# Patient Record
Sex: Male | Born: 1945
Health system: Southern US, Community
[De-identification: ages and names within clinical notes are randomized; demographics above are authoritative.]

## PROBLEM LIST (undated history)

## (undated) DIAGNOSIS — R768 Other specified abnormal immunological findings in serum: Secondary | ICD-10-CM

## (undated) DIAGNOSIS — B019 Varicella without complication: Secondary | ICD-10-CM

## (undated) DIAGNOSIS — B269 Mumps without complication: Secondary | ICD-10-CM

## (undated) DIAGNOSIS — H9319 Tinnitus, unspecified ear: Secondary | ICD-10-CM

## (undated) DIAGNOSIS — B069 Rubella without complication: Secondary | ICD-10-CM

## (undated) DIAGNOSIS — I471 Supraventricular tachycardia: Secondary | ICD-10-CM

## (undated) DIAGNOSIS — K649 Unspecified hemorrhoids: Secondary | ICD-10-CM

## (undated) DIAGNOSIS — C801 Malignant (primary) neoplasm, unspecified: Secondary | ICD-10-CM

## (undated) HISTORY — DX: Varicella without complication: B01.9

## (undated) HISTORY — DX: Rubella without complication: B06.9

## (undated) HISTORY — DX: Supraventricular tachycardia: I47.1

## (undated) HISTORY — DX: Mumps without complication: B26.9

## (undated) HISTORY — DX: Tinnitus, unspecified ear: H93.19

## (undated) HISTORY — DX: Other specified abnormal immunological findings in serum: R76.8

## (undated) HISTORY — DX: Unspecified hemorrhoids: K64.9

---
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
## 1971-05-23 HISTORY — PX: INGUINAL HERNIA REPAIR: SUR1180

## 1991-05-23 DIAGNOSIS — I471 Supraventricular tachycardia, unspecified: Secondary | ICD-10-CM

## 1991-05-23 HISTORY — DX: Supraventricular tachycardia: I47.1

## 1991-05-23 HISTORY — DX: Supraventricular tachycardia, unspecified: I47.10

## 2001-04-21 HISTORY — PX: INGUINAL HERNIA REPAIR: SUR1180

## 2001-06-21 ENCOUNTER — Ambulatory Visit (HOSPITAL_BASED_OUTPATIENT_CLINIC_OR_DEPARTMENT_OTHER): Admission: RE | Admit: 2001-06-21 | Discharge: 2001-06-21 | Payer: Self-pay | Admitting: Surgery

## 2001-06-21 ENCOUNTER — Encounter (INDEPENDENT_AMBULATORY_CARE_PROVIDER_SITE_OTHER): Payer: Self-pay

## 2004-08-10 ENCOUNTER — Encounter: Admission: RE | Admit: 2004-08-10 | Discharge: 2004-09-20 | Payer: Self-pay | Admitting: Family Medicine

## 2010-04-21 HISTORY — PX: INGUINAL HERNIA REPAIR: SUR1180

## 2010-05-04 ENCOUNTER — Ambulatory Visit (HOSPITAL_COMMUNITY)
Admission: RE | Admit: 2010-05-04 | Discharge: 2010-05-04 | Payer: Self-pay | Source: Home / Self Care | Attending: Surgery | Admitting: Surgery

## 2010-08-02 LAB — CBC
HCT: 47.7 % (ref 39.0–52.0)
Hemoglobin: 16.2 g/dL (ref 13.0–17.0)
MCH: 32.4 pg (ref 26.0–34.0)
MCHC: 34 g/dL (ref 30.0–36.0)
MCV: 95.4 fL (ref 78.0–100.0)
RBC: 5 MIL/uL (ref 4.22–5.81)

## 2010-08-02 LAB — BASIC METABOLIC PANEL
Calcium: 9.3 mg/dL (ref 8.4–10.5)
Chloride: 101 mEq/L (ref 96–112)
GFR calc non Af Amer: >60 mL/min (ref >60)
Glucose, Bld: 93 mg/dL (ref 70–99)
Potassium: 4.1 mEq/L (ref 3.5–5.1)

## 2010-08-02 LAB — SURGICAL PCR SCREEN: Staphylococcus aureus: NEGATIVE

## 2010-10-07 NOTE — Op Note (Signed)
[facility]  Patient:    Jack Huerta, Jack Huerta Visit Number: [id number] MRN: [id number]          Service Type: NES Location: [facility] Attending Physician:  Charlton Haws Dictated by:   Currie Paris, M.D. Proc. Date: 06/21/01 Admit Date:  06/21/2001   CC:         Caryn Bee C. Sydnee Levans, M.D.   Operative Report  VISIT NUMBER:  [id number]  [id number]  PREOPERATIVE DIAGNOSIS:  Right inguinal hernia.  POSTOPERATIVE DIAGNOSIS:  Right inguinal hernia.  OPERATION:  Repair of right inguinal hernia with mesh.  SURGEON:  Currie Paris, M.D.  ANESTHESIA:  MAC.  CLINICAL HISTORY:  This patient is a 65 year old male with a right inguinal hernia which he desired to have repaired.  After discussion with the patient, he elected to proceed to repair.  DESCRIPTION OF PROCEDURE:  The patient was seen in the holding area, and the operative site identified, confirmed as the right inguinal hernia and marked. He was then taken to the operating room and given IV sedation.  The right groin was shaved, prepped and draped.  A combination of 1% Xylocaine and 0.5% plain Marcaine with epinephrine was mixed equally and used for local.  I infiltrated along the skin line and then above the fascia at the anterior superior iliac spine.  The inguinal incision was made and deep to the external oblique with bleeders either tied with 3-0 Vicryl or electrocoagulated.  The external oblique was identified and opened in line with its fibers with additional local being infiltrated as we went through deeper layers.  The cord was dissected up off the inguinal floor and surrounded with a Penrose drain and cleaned off.  The inguinal floor appeared attenuated, but there was no clear-cut direct hernia.  Dissection of the cord, however, showed an indirect sac which was stripped off the cord.  It was fairly broad-based, but I opened it and was able to make sure there was nothing  protruding out and then closed it with a suture of 2-0 silk and amputated the excess sac.  It retracted nicely under the deep ring.  There was not a huge deep ring, so I elected not to put a plug in it but simply to leave a mesh patch across the whole area for a tension-free repair.  The patch was sutured in starting medially with a running suture going along the inferior aspect.  It was then tacked medially to the internal oblique. The tails were crossed to go well out beyond the deep ring and a suture to hold the tails together.  There was plenty of room for the cord to come through, but I do not think there was enough that any other material would come through such as a recurrent hernia.  Everything appeared dry after an irrigation.  The external oblique was closed over the repair with 3-0 Vicryl, Scarpas with 3-0 Vicryl and the skin with 4-0 Monocryl subcuticular plus Steri-Strips.  The patient tolerated the procedure well.  There were no operative complications.  All counts were correct. Dictated by:   Currie Paris, M.D. Attending Physician:  Charlton Haws DD:  06/21/01 TD:  06/21/01 Job: [id number]

## 2011-01-12 ENCOUNTER — Encounter: Payer: Self-pay | Admitting: Gastroenterology

## 2011-03-06 ENCOUNTER — Other Ambulatory Visit: Payer: Self-pay | Admitting: Surgery

## 2011-08-02 ENCOUNTER — Encounter: Payer: Self-pay | Admitting: Gastroenterology

## 2011-08-02 ENCOUNTER — Ambulatory Visit (AMBULATORY_SURGERY_CENTER): Payer: Medicare HMO

## 2011-08-02 VITALS — Ht 69.0 in | Wt 148.0 lb

## 2011-08-02 DIAGNOSIS — Z1211 Encounter for screening for malignant neoplasm of colon: Secondary | ICD-10-CM

## 2011-08-02 MED ORDER — PEG-KCL-NACL-NASULF-NA ASC-C 100 G PO SOLR: 1.0000 | Freq: Once | ORAL | Status: DC

## 2011-08-07 ENCOUNTER — Encounter: Payer: Self-pay | Admitting: Gastroenterology

## 2011-08-07 ENCOUNTER — Ambulatory Visit (AMBULATORY_SURGERY_CENTER): Payer: Medicare HMO | Admitting: Gastroenterology

## 2011-08-07 VITALS — BP 141/77 | HR 71 | Temp 96.8°F | Resp 14 | Ht 69.0 in | Wt 148.0 lb

## 2011-08-07 DIAGNOSIS — Z1211 Encounter for screening for malignant neoplasm of colon: Secondary | ICD-10-CM

## 2011-08-07 DIAGNOSIS — K5730 Diverticulosis of large intestine without perforation or abscess without bleeding: Secondary | ICD-10-CM | POA: Insufficient documentation

## 2011-08-07 MED ORDER — SODIUM CHLORIDE 0.9 % IV SOLN: 500.0000 mL | INTRAVENOUS | Status: DC

## 2011-08-07 NOTE — Progress Notes (Signed)
Patient did not experience any of the following events: a burn prior to discharge; a fall within the facility; wrong site/side/patient/procedure/implant event; or a hospital transfer or hospital admission upon discharge from the facility. (G8907) Patient did not have preoperative order for IV antibiotic SSI prophylaxis. (G8918)  

## 2011-08-07 NOTE — Op Note (Signed)
[facility] [address] [place], [place]  [address]  COLONOSCOPY PROCEDURE REPORT  PATIENT:  Jack, Huerta  MR#:  [id number] BIRTHDATE:  1946-04-05, 65 yrs. old  GENDER:  male ENDOSCOPIST:  Vania Rea. Jarold Motto, MD, [facility] REF. BY: PROCEDURE DATE:  08/07/2011 PROCEDURE:  Average-risk screening colonoscopy G0121 ASA CLASS:  Class II INDICATIONS:  colorectal cancer screening MEDICATIONS:   propofol (Diprivan) 180 mg IV  DESCRIPTION OF PROCEDURE:   After the risks and benefits and of the procedure were explained, informed consent was obtained. Digital rectal exam was performed and revealed no abnormalities. The LB 180AL [id number] endoscope was introduced through the anus and advanced to the cecum, which was identified by both the appendix and ileocecal valve.  The quality of the prep was excellent, using MoviPrep.  The instrument was then slowly withdrawn as the colon was fully examined. <<PROCEDUREIMAGES>>  FINDINGS:  There were mild diverticular changes in left colon. diverticulosis was found.  No polyps or cancers were seen. Retroflexed views in the rectum revealed no abnormalities.    The scope was then withdrawn from the patient and the procedure completed.  COMPLICATIONS:  None ENDOSCOPIC IMPRESSION: 1) Diverticulosis,mild,left sided diverticulosis 2) No polyps or cancers RECOMMENDATIONS: 1) Continue current colorectal screening recommendations for "routine risk" patients with a repeat colonoscopy in 10 years. 2) High fiber diet  REPEAT EXAM:  No  ______________________________ Vania Rea. Jarold Motto, MD, [name]  CC:  Berenda Morale, MD  [name] Kitchen   [name] at 08/07/2011 11:03 AM  [name], [id number]

## 2011-08-07 NOTE — Patient Instructions (Signed)
YOU HAD AN ENDOSCOPIC PROCEDURE TODAY AT THE [facility]: Refer to the procedure report that was given to you for any specific questions about what was found during the examination.  If the procedure report does not answer your questions, please call your gastroenterologist to clarify.  If you requested that your care partner not be given the details of your procedure findings, then the procedure report has been included in a sealed envelope for you to review at your convenience later.  YOU SHOULD EXPECT: Some feelings of bloating in the abdomen. Passage of more gas than usual.  Walking can help get rid of the air that was put into your GI tract during the procedure and reduce the bloating. If you had a lower endoscopy (such as a colonoscopy or flexible sigmoidoscopy) you may notice spotting of blood in your stool or on the toilet paper. If you underwent a bowel prep for your procedure, then you may not have a normal bowel movement for a few days.  DIET: Your first meal following the procedure should be a light meal and then it is ok to progress to your normal diet.  A half-sandwich or bowl of soup is an example of a good first meal.  Heavy or fried foods are harder to digest and may make you feel nauseous or bloated.  Likewise meals heavy in dairy and vegetables can cause extra gas to form and this can also increase the bloating.  Drink plenty of fluids but you should avoid alcoholic beverages for 24 hours.  ACTIVITY: Your care partner should take you home directly after the procedure.  You should plan to take it easy, moving slowly for the rest of the day.  You can resume normal activity the day after the procedure however you should NOT DRIVE or use heavy machinery for 24 hours (because of the sedation medicines used during the test).    SYMPTOMS TO REPORT IMMEDIATELY: A gastroenterologist can be reached at any hour.  During normal business hours, 8:30 AM to 5:00 PM Monday through Friday,  call [phone].  After hours and on weekends, please call the GI answering service at [phone] who will take a message and have the physician on call contact you.   Following lower endoscopy (colonoscopy or flexible sigmoidoscopy):  Excessive amounts of blood in the stool  Significant tenderness or worsening of abdominal pains  Swelling of the abdomen that is new, acute  Fever of 100F or higher  Following upper endoscopy (EGD)  Vomiting of blood or coffee ground material  New chest pain or pain under the shoulder blades  Painful or persistently difficult swallowing  New shortness of breath  Fever of 100F or higher  Black, tarry-looking stools  FOLLOW UP: If any biopsies were taken you will be contacted by phone or by letter within the next 1-3 weeks.  Call your gastroenterologist if you have not heard about the biopsies in 3 weeks.  Our staff will call the home number listed on your records the next business day following your procedure to check on you and address any questions or concerns that you may have at that time regarding the information given to you following your procedure. This is a courtesy call and so if there is no answer at the home number and we have not heard from you through the emergency physician on call, we will assume that you have returned to your regular daily activities without incident.  SIGNATURES/CONFIDENTIALITY: You and/or your care   partner have signed paperwork which will be entered into your electronic medical record.  These signatures attest to the fact that that the information above on your After Visit Summary has been reviewed and is understood.  Full responsibility of the confidentiality of this discharge information lies with you and/or your care-partner.  

## 2011-08-08 ENCOUNTER — Telehealth: Payer: Self-pay | Admitting: *Deleted

## 2011-08-08 NOTE — Telephone Encounter (Signed)
  Follow up Call-  Call back number 08/07/2011  Post procedure Call Back phone  # [phone]  Permission to leave phone message Yes     Patient questions:  Do you have a fever, pain , or abdominal swelling? no   Have you tolerated food without any problems? yes  Have you been able to return to your normal activities? yes  Do you have any questions about your discharge instructions: Diet   no Medications  no Follow up visit  no  Do you have questions or concerns about your Care? no  Actions: * If pain score is 4 or above:

## 2013-03-18 ENCOUNTER — Other Ambulatory Visit: Payer: Self-pay | Admitting: Cardiology

## 2013-03-18 MED ORDER — ATENOLOL 25 MG PO TABS: 25.0000 mg | ORAL_TABLET | Freq: Two times a day (BID) | ORAL | Status: DC

## 2013-03-20 ENCOUNTER — Other Ambulatory Visit: Payer: Self-pay | Admitting: Family Medicine

## 2013-03-20 DIAGNOSIS — N644 Mastodynia: Secondary | ICD-10-CM

## 2013-04-09 ENCOUNTER — Ambulatory Visit
Admission: RE | Admit: 2013-04-09 | Discharge: 2013-04-09 | Disposition: A | Discharge status: Home / Self Care | Payer: Medicare Other | Source: Ambulatory Visit | Attending: Family Medicine | Admitting: Family Medicine

## 2013-04-09 DIAGNOSIS — N644 Mastodynia: Secondary | ICD-10-CM

## 2013-10-16 ENCOUNTER — Other Ambulatory Visit: Payer: Self-pay | Admitting: *Deleted

## 2013-10-16 ENCOUNTER — Other Ambulatory Visit: Payer: Self-pay

## 2013-10-16 MED ORDER — ATENOLOL 25 MG PO TABS: 25.0000 mg | ORAL_TABLET | Freq: Two times a day (BID) | ORAL | Status: DC

## 2013-11-12 ENCOUNTER — Encounter: Payer: Self-pay | Admitting: Cardiology

## 2013-12-01 ENCOUNTER — Encounter: Payer: Self-pay | Admitting: Cardiology

## 2013-12-09 ENCOUNTER — Ambulatory Visit: Payer: Medicare HMO | Admitting: Cardiology

## 2014-01-05 ENCOUNTER — Encounter: Payer: Self-pay | Admitting: *Deleted

## 2014-01-14 ENCOUNTER — Encounter: Payer: Self-pay | Admitting: Cardiology

## 2014-01-14 ENCOUNTER — Ambulatory Visit (INDEPENDENT_AMBULATORY_CARE_PROVIDER_SITE_OTHER): Payer: Medicare HMO | Admitting: Cardiology

## 2014-01-14 VITALS — BP 110/72 | HR 49 | Ht 69.0 in | Wt 151.0 lb

## 2014-01-14 DIAGNOSIS — I471 Supraventricular tachycardia: Secondary | ICD-10-CM

## 2014-01-14 DIAGNOSIS — R002 Palpitations: Secondary | ICD-10-CM

## 2014-01-14 DIAGNOSIS — I498 Other specified cardiac arrhythmias: Secondary | ICD-10-CM

## 2014-01-14 DIAGNOSIS — R001 Bradycardia, unspecified: Secondary | ICD-10-CM

## 2014-01-14 MED ORDER — ATENOLOL 25 MG PO TABS: 25.0000 mg | ORAL_TABLET | Freq: Every day | ORAL | Status: DC

## 2014-01-14 NOTE — Progress Notes (Signed)
Jack Huerta. [address], [address] [place], [place]  [address] Phone: [phone] Fax:  [phone]  Date:  01/14/2014   ID:  Jack Huerta, DOB [birth year], MRN [id number]  PCP:  Marcello Fennel, MD   History of Present Illness: Jack Huerta is a 68 y.o. male with atrial fibrillation/paroxysmal SVT, palpitations former patient of Dr. Gilman Schmidt, previous echo within normal limits, ETT demonstrating SVT in the past.   We had issues with cost of Bystolic in the past, change because of fatigue on atenolol. He actually went back to the atenolol and felt better. He has increased anxiety. He had a treadmill test in October of 2013 where he ran for 12 minutes and 43 seconds, excellent with no ST segment changes. Immediately after stopping the treadmill he had a brief episode of SVT with heart rate increasing to approximately 180 beats per minute transiently. No evidence of prolonged SVT or atrial fibrillation. At that point in October of 2013, I increased his atenolol from 25 once a day to 25 twice a day.   Previous monitor showed frequent PACs up to 16,000. Echocardiogram showed once again normal EF with mild mitral regurgitation.  He states that he is doing quite well, occasionally will feel palpitations when at rest but he is not feeling them when exercising. He states that he is exercising quite vigorously. No high-risk symptoms such as syncope. No chest pain. At prior visit, he was concerned about the dose of the atenolol and perhaps being on too much of that medication or becoming resistant to that medication. I do believe that this is a fine does for him.  01/14/14 -  Bradycardia has been present with heart rate in the 40s, asymptomatic. Cut back on atenolol to 25mg  once a day.  Worried that palptations may return.    Wt Readings from Last 3 Encounters:  01/14/14 151 lb (68.493 kg)  08/07/11 148 lb (67.132 kg)  08/02/11 148 lb (67.132 kg)     Past Medical History  Diagnosis Date    . SVT (supraventricular tachycardia) 1993    exercise induced    Past Surgical History  Procedure Laterality Date  . Inguinal hernia repair  04/2010    left  . Inguinal hernia repair  04/2001    right  . Inguinal hernia repair  1973    left  . Tonsillectomy  1962  . Appendectomy  1958    Current Outpatient Prescriptions  Medication Sig Dispense Refill  . aspirin 81 MG tablet Take 81 mg by mouth daily.      Marland Kitchen atenolol (TENORMIN) 25 MG tablet Take 1 tablet (25 mg total) by mouth 2 (two) times daily.  60 tablet  12  . Multiple Vitamin (MULTIVITAMIN) capsule Take 1 capsule by mouth daily. Centrum Silver for Men       No current facility-administered medications for this visit.    Allergies:   No Known Allergies  Social History:  The patient  reports that he has never smoked. He has never used smokeless tobacco.   Family History  Problem Relation Age of Onset  . Colon cancer Neg Hx   . Leukemia Sister   . Chronic fatigue Brother   . Prostate cancer Father     ROS:  Please see the history of present illness.   Denies any bleeding, syncope, orthopnea, PND   All other systems reviewed and negative.   PHYSICAL EXAM: VS:  BP 110/72  Pulse 49  Ht 5\' 9"  (1.753 m)  Wt 151 lb (68.493 kg)  BMI 22.29 kg/m2 Well nourished, well developed, in no acute distress HEENT: normal, [place]/AT, EOMI Neck: no JVD, normal carotid upstroke, no bruit Cardiac:  normal S1, S2; RRR; no murmur Lungs:  clear to auscultation bilaterally, no wheezing, rhonchi or rales Abd: soft, nontender, no hepatomegaly, no bruits Ext: no edema, 2+ distal pulses Skin: warm and dry GU: deferred Neuro: no focal abnormalities noted, AAO x 3  EKG:  01/14/14-sinus bradycardia rate 45, incomplete right bundle branch block pattern    ASSESSMENT AND PLAN:  1. SVT/palpitations - he's concerned that since we are decreasing his atenolol back to 25 mg once a day from 25 mg twice a day that he will once again had more  episodes of palpitations/SVT likely demonstrated on previous exercise treadmill test. My fear is that with his bradycardia that he may become symptomatic with this. We discussed his options of continuing with the atenolol 50 mg and watching for symptoms of symptomatic bradycardia or decreasing the atenolol and try to increase his baseline heart rate. I explained him that in the future, he may not be able to tolerate heart rate of 45 very well. He may also come to pacemaker in order to treat his SVT effectively with beta blocker. At that point, electrophysiology referral will be made. If he feels SVT again, I will likely place an event monitor at that time to catch this and refer him to EP for opinion on pacemaker/EP study. 2. Bradycardia-decreasing atenolol to 25 mg once a day 3. 6 month followup  Signed, Candee Furbish, MD [facility]  01/14/2014 9:48 AM

## 2014-01-14 NOTE — Patient Instructions (Signed)
Please decrease your Atenolol to 25 mg once a day. Continue all other medications as listed.  Please contact us if you have any increase in palpitations.  Follow up in 6 months with Dr. Marlou Porch.  You will receive a letter in the mail 2 months before you are due.  Please call us when you receive this letter to schedule your follow up appointment.

## 2014-01-28 ENCOUNTER — Encounter: Payer: Self-pay | Admitting: Cardiology

## 2014-02-13 ENCOUNTER — Encounter: Payer: Self-pay | Admitting: *Deleted

## 2014-03-03 ENCOUNTER — Encounter: Payer: Self-pay | Admitting: Gastroenterology

## 2014-07-13 ENCOUNTER — Ambulatory Visit: Payer: Medicare HMO | Admitting: Cardiology

## 2014-07-24 ENCOUNTER — Encounter: Payer: Self-pay | Admitting: Cardiology

## 2014-07-24 ENCOUNTER — Ambulatory Visit (INDEPENDENT_AMBULATORY_CARE_PROVIDER_SITE_OTHER): Payer: Medicare HMO | Admitting: Cardiology

## 2014-07-24 VITALS — BP 122/84 | HR 49 | Ht 69.0 in | Wt 156.0 lb

## 2014-07-24 DIAGNOSIS — I471 Supraventricular tachycardia: Secondary | ICD-10-CM

## 2014-07-24 DIAGNOSIS — R002 Palpitations: Secondary | ICD-10-CM

## 2014-07-24 NOTE — Patient Instructions (Signed)
The current medical regimen is effective;  continue present plan and medications.  Follow up in 1 year with Dr. Skains.  You will receive a letter in the mail 2 months before you are due.  Please call us when you receive this letter to schedule your follow up appointment.  Thank you for choosing [facility]!!     

## 2014-07-24 NOTE — Progress Notes (Signed)
Jack Huerta. [address] [place], [place]  [address] Phone: [phone] Fax:  [phone]  Date:  07/24/2014   ID:  Jack Huerta, DOB 04-Mar-1946, MRN [id number]  PCP:  Simona Huh, MD   History of Present Illness: Jack Huerta is a 69 y.o. male with atrial fibrillation/paroxysmal SVT, palpitations former patient of Dr. Gilman Schmidt, previous echo within normal limits, ETT demonstrating SVT in the past, up to180 bpm on treadmill.   We had issues with cost of Bystolic in the past, change because of fatigue on atenolol. He actually went back to the atenolol and felt better. He has increased anxiety. He had a treadmill test in October of 2013 where he ran for 12 minutes and 43 seconds, excellent with no ST segment changes. Immediately after stopping the treadmill he had a brief episode of SVT with heart rate increasing to approximately 180 beats per minute transiently. No evidence of prolonged SVT or atrial fibrillation.   Previous monitor showed frequent PACs up to 16,000. Echocardiogram showed once again normal EF with mild mitral regurgitation.  He states that he is doing quite well, occasionally will feel palpitations when at rest but he is not feeling them when exercising. He states that he is exercising quite vigorously. He is trying to run a 10 minute mile, 5K. No high-risk symptoms such as syncope. No chest pain.  He is normally running or exercising with a target heart rate of 130 bpm.  Wt Readings from Last 3 Encounters:  07/24/14 156 lb (70.761 kg)  01/14/14 151 lb (68.493 kg)  08/07/11 148 lb (67.132 kg)     Past Medical History  Diagnosis Date  . SVT (supraventricular tachycardia) 1993    exercise induced  . Tinnitus   . False positive serological test for hepatitis C   . Mumps   . Chicken pox   . [place] measles   . SVT (supraventricular tachycardia)   . Hemorrhoids     Past Surgical History  Procedure Laterality Date  . Inguinal hernia repair   04/2010    left  . Inguinal hernia repair  04/2001    right  . Inguinal hernia repair  1973    left  . Tonsillectomy  1962  . Appendectomy  1958    Current Outpatient Prescriptions  Medication Sig Dispense Refill  . aspirin 81 MG tablet Take 81 mg by mouth daily.    Marland Kitchen atenolol (TENORMIN) 25 MG tablet Take 1 tablet (25 mg total) by mouth daily.    . Cholecalciferol (VITAMIN D3) 1000 UNITS CAPS Take 1 capsule by mouth daily.    . Multiple Vitamin (MULTIVITAMIN) capsule Take 1 capsule by mouth daily. Centrum Silver for Men     No current facility-administered medications for this visit.    Allergies:   No Known Allergies  Social History:  The patient  reports that he has never smoked. He has never used smokeless tobacco.   Family History  Problem Relation Age of Onset  . Colon cancer Neg Hx   . Leukemia Sister   . Chronic fatigue Brother   . Prostate cancer Father   . Aneurysm Father   . Arthritis/Rheumatoid Mother     ROS:  Please see the history of present illness.   Denies any bleeding, syncope, orthopnea, PND   All other systems reviewed and negative.   PHYSICAL EXAM: VS:  BP 122/84 mmHg  Pulse 49  Ht 5\' 9"  (1.753 m)  Wt 156  lb (70.761 kg)  BMI 23.03 kg/m2  SpO2 95% Well nourished, well developed, in no acute distress HEENT: normal, [place]/AT, EOMI Neck: no JVD, normal carotid upstroke, no bruit Cardiac:  normal S1, S2; bradycardic regular; no murmur Lungs:  clear to auscultation bilaterally, no wheezing, rhonchi or rales Abd: soft, nontender, no hepatomegaly, no bruits Ext: no edema, 2+ distal pulses Skin: warm and dry GU: deferred Neuro: no focal abnormalities noted, AAO x 3  EKG:  01/14/14-sinus bradycardia rate 45, incomplete right bundle branch block pattern    ASSESSMENT AND PLAN:  1. SVT/palpitations - he is doing well with atenolol back to 25 mg once a day from 25 mg twice a day. No more episodes of palpitations/SVT likely demonstrated on previous  exercise treadmill test. My fear is that with his bradycardia that he may become symptomatic with this.  I explained him that in the future, he may not be able to tolerate heart rate of 45 very well. He may also come to pacemaker in order to treat his SVT effectively with beta blocker. At that point, electrophysiology referral will be made. If he feels SVT again, I will likely place an event monitor at that time to catch this and refer him to EP for opinion on pacemaker/EP study. 2. Bradycardia-decreased atenolol to 25 mg once a day. Excellent exercise. 3. 12 month followup  Signed, Candee Furbish, MD [facility]  07/24/2014 8:15 AM

## 2015-03-12 DIAGNOSIS — Z23 Encounter for immunization: Secondary | ICD-10-CM | POA: Diagnosis not present

## 2015-03-15 DIAGNOSIS — W208XXA Other cause of strike by thrown, projected or falling object, initial encounter: Secondary | ICD-10-CM | POA: Diagnosis not present

## 2015-03-15 DIAGNOSIS — S0101XA Laceration without foreign body of scalp, initial encounter: Secondary | ICD-10-CM | POA: Diagnosis not present

## 2015-03-31 DIAGNOSIS — Z4802 Encounter for removal of sutures: Secondary | ICD-10-CM | POA: Diagnosis not present

## 2015-03-31 DIAGNOSIS — S0101XD Laceration without foreign body of scalp, subsequent encounter: Secondary | ICD-10-CM | POA: Diagnosis not present

## 2015-05-18 DIAGNOSIS — H6981 Other specified disorders of Eustachian tube, right ear: Secondary | ICD-10-CM | POA: Diagnosis not present

## 2015-07-21 ENCOUNTER — Encounter: Payer: Self-pay | Admitting: *Deleted

## 2015-07-26 ENCOUNTER — Encounter: Payer: Self-pay | Admitting: Cardiology

## 2015-07-26 ENCOUNTER — Ambulatory Visit (INDEPENDENT_AMBULATORY_CARE_PROVIDER_SITE_OTHER): Payer: Medicare HMO | Admitting: Cardiology

## 2015-07-26 VITALS — BP 104/76 | HR 51 | Ht 69.0 in | Wt 152.8 lb

## 2015-07-26 DIAGNOSIS — I471 Supraventricular tachycardia: Secondary | ICD-10-CM

## 2015-07-26 DIAGNOSIS — R002 Palpitations: Secondary | ICD-10-CM | POA: Insufficient documentation

## 2015-07-26 DIAGNOSIS — R001 Bradycardia, unspecified: Secondary | ICD-10-CM

## 2015-07-26 MED ORDER — ATENOLOL 25 MG PO TABS: 25.0000 mg | ORAL_TABLET | Freq: Every day | ORAL | Status: DC

## 2015-07-26 NOTE — Progress Notes (Signed)
[facility]. [address], [address] [place], [place]  [address] Phone: [phone] Fax:  [phone]  Date:  07/26/2015   ID:  MASEO LATTIN, DOB 07/14/1945, MRN [id number]  PCP:  Simona Huh, MD   History of Present Illness: Jack Huerta is a 70 y.o. male with atrial fibrillation/paroxysmal SVT, palpitations former patient of Dr. Gilman Schmidt, previous echo within normal limits, ETT demonstrating SVT in the past, up to180 bpm on treadmill.   We had issues with cost of Bystolic in the past, change because of fatigue on atenolol. He actually went back to the atenolol and felt better. He has increased anxiety. He had a treadmill test in October of 2013 where he ran for 12 minutes and 43 seconds, excellent with no ST segment changes. Immediately after stopping the treadmill he had a brief episode of SVT with heart rate increasing to approximately 180 beats per minute transiently. No evidence of prolonged SVT or atrial fibrillation.   Previous monitor showed frequent PACs up to 16,000. Echocardiogram showed once again normal EF with mild mitral regurgitation.  He is normally running or exercising with a target heart rate of 130 bpm. he hasn't done much exercising since December but overall he is feeling well. He had a fit bit which sometimes is accurate for his heart rate. No syncope, no bleeding.  Wt Readings from Last 3 Encounters:  07/26/15 152 lb 12.8 oz (69.31 kg)  07/24/14 156 lb (70.761 kg)  01/14/14 151 lb (68.493 kg)     Past Medical History  Diagnosis Date  . SVT (supraventricular tachycardia) ([place]) 1993    exercise induced  . Tinnitus   . False positive serological test for hepatitis C   . Mumps   . Chicken pox   . [place] measles   . SVT (supraventricular tachycardia) ([place])   . Hemorrhoids     Past Surgical History  Procedure Laterality Date  . Inguinal hernia repair  04/2010    left  . Inguinal hernia repair  04/2001    right  . Inguinal hernia repair  1973     left  . Tonsillectomy  1962  . Appendectomy  1958    Current Outpatient Prescriptions  Medication Sig Dispense Refill  . aspirin 81 MG tablet Take 81 mg by mouth daily.    Marland Kitchen atenolol (TENORMIN) 25 MG tablet Take 1 tablet (25 mg total) by mouth daily.    . Cholecalciferol (VITAMIN D3) 5000 units CAPS Take 1 capsule by mouth daily.    . Multiple Vitamin (MULTIVITAMIN) capsule Take 1 capsule by mouth daily. Centrum Silver for Men    . vitamin B-12 (CYANOCOBALAMIN) 500 MCG tablet Take 500 mcg by mouth daily.     No current facility-administered medications for this visit.    Allergies:   No Known Allergies  Social History:  The patient  reports that he has never smoked. He has never used smokeless tobacco.   Family History  Problem Relation Age of Onset  . Colon cancer Neg Hx   . Leukemia Sister   . Chronic fatigue Brother   . Prostate cancer Father   . Aneurysm Father   . Arthritis/Rheumatoid Mother    no early family history of CAD  ROS:  Please see the history of present illness.   Denies any bleeding, syncope, orthopnea, PND   All other systems reviewed and negative.   PHYSICAL EXAM: VS:  BP 104/76 mmHg  Pulse 51  Ht 5\' 9"  (  1.753 m)  Wt 152 lb 12.8 oz (69.31 kg)  BMI 22.55 kg/m2  SpO2 97% Well nourished, well developed, in no acute distress HEENT: normal, [place]/AT, EOMI Neck: no JVD, normal carotid upstroke, no bruit Cardiac:  normal S1, S2; bradycardic regular; no murmur Lungs:  clear to auscultation bilaterally, no wheezing, rhonchi or rales Abd: soft, nontender, no hepatomegaly, no bruits Ext: no edema, 2+ distal pulses Skin: warm and dry GU: deferred Neuro: no focal abnormalities noted, AAO x 3  EKG:  Today ordered EKG 07/26/15 sinus bradycardia rate 49, right bundle branch block incomplete with no other significant abnormalities.  01/14/14-sinus bradycardia rate 45, incomplete right bundle branch block pattern    ASSESSMENT AND PLAN:  1. SVT/palpitations - he  is doing well with atenolol back to 25 mg once a day from 25 mg twice a day. No more episodes of palpitations/SVT likely demonstrated on previous exercise treadmill test. My fear is that with his bradycardia that he may become symptomatic with this. He may also come to pacemaker in order to treat his SVT effectively with beta blocker. At that point, electrophysiology referral will be made. If he feels SVT again, I will likely place an event monitor at that time to catch this and refer him to EP for opinion on pacemaker/EP study. 2. Bradycardia-decreased atenolol to 25 mg once a day. Excellent exercise. 3. 12 month followup  Signed, Candee Furbish, MD [facility]  07/26/2015 8:13 AM

## 2015-07-26 NOTE — Patient Instructions (Addendum)

## 2015-07-27 DIAGNOSIS — R69 Illness, unspecified: Secondary | ICD-10-CM | POA: Diagnosis not present

## 2015-11-11 DIAGNOSIS — M25522 Pain in left elbow: Secondary | ICD-10-CM | POA: Diagnosis not present

## 2015-11-11 DIAGNOSIS — G8929 Other chronic pain: Secondary | ICD-10-CM | POA: Diagnosis not present

## 2015-11-11 DIAGNOSIS — M25511 Pain in right shoulder: Secondary | ICD-10-CM | POA: Diagnosis not present

## 2015-11-11 DIAGNOSIS — M19011 Primary osteoarthritis, right shoulder: Secondary | ICD-10-CM | POA: Diagnosis not present

## 2016-03-01 ENCOUNTER — Telehealth: Payer: Self-pay | Admitting: *Deleted

## 2016-03-01 NOTE — Telephone Encounter (Signed)
Patient is stating that his pharmacy told him that there is a nationwide back order on Atenolol. There is not a guarantee date of when it will be in. Is there a different medication that he can take? Please advise.

## 2016-03-01 NOTE — Telephone Encounter (Signed)
Left message for pt that most all [facility] locations have Atentolol.  Requested he c/b if he needs an RX sent there.

## 2016-04-06 DIAGNOSIS — R69 Illness, unspecified: Secondary | ICD-10-CM | POA: Diagnosis not present

## 2016-07-25 ENCOUNTER — Encounter: Payer: Self-pay | Admitting: Cardiology

## 2016-08-02 ENCOUNTER — Encounter: Payer: Self-pay | Admitting: Cardiology

## 2016-08-02 ENCOUNTER — Encounter (INDEPENDENT_AMBULATORY_CARE_PROVIDER_SITE_OTHER): Payer: Self-pay

## 2016-08-02 ENCOUNTER — Ambulatory Visit (INDEPENDENT_AMBULATORY_CARE_PROVIDER_SITE_OTHER): Payer: Medicare HMO | Admitting: Cardiology

## 2016-08-02 VITALS — BP 120/82 | HR 52 | Ht 69.0 in | Wt 152.0 lb

## 2016-08-02 DIAGNOSIS — R001 Bradycardia, unspecified: Secondary | ICD-10-CM | POA: Diagnosis not present

## 2016-08-02 DIAGNOSIS — R002 Palpitations: Secondary | ICD-10-CM

## 2016-08-02 DIAGNOSIS — I471 Supraventricular tachycardia: Secondary | ICD-10-CM | POA: Diagnosis not present

## 2016-08-02 MED ORDER — ATENOLOL 25 MG PO TABS: 25.0000 mg | ORAL_TABLET | Freq: Every day | ORAL | 3 refills | Status: DC

## 2016-08-02 NOTE — Progress Notes (Signed)
[facility]. [address], [address] [place], [place]  [address] Phone: [phone] Fax:  [phone]  Date:  08/02/2016   ID:  GIANNI MIHALIK, DOB [birth year], MRN [id number]  PCP:  Simona Huh, MD   History of Present Illness: Jack Huerta is a 71 y.o. male with atrial fibrillation/paroxysmal SVT, palpitations former patient of Dr. Gilman Schmidt, previous echo within normal limits, ETT demonstrating SVT in the past, up to180 bpm on treadmill.   We had issues with cost of Bystolic in the past, change because of fatigue on atenolol. He actually went back to the atenolol and felt better. He has increased anxiety. He had a treadmill test in October of 2013 where he ran for 12 minutes and 43 seconds, excellent with no ST segment changes. Immediately after stopping the treadmill he had a brief episode of SVT with heart rate increasing to approximately 180 beats per minute transiently. No evidence of prolonged SVT or atrial fibrillation.   Previous monitor showed frequent PACs up to 16,000. Echocardiogram showed once again normal EF with mild mitral regurgitation.  Overall he has been doing quite well exercising well, no chest pain, no shortness of breath, no syncope, no fevers. No change in endurance. Weight stable. See below for further discussion.  Wt Readings from Last 3 Encounters:  08/02/16 152 lb (68.9 kg)  07/26/15 152 lb 12.8 oz (69.3 kg)  07/24/14 156 lb (70.8 kg)     Past Medical History:  Diagnosis Date  . Chicken pox   . False positive serological test for hepatitis C   . [place] measles   . Hemorrhoids   . Mumps   . SVT (supraventricular tachycardia) ([place]) 1993   exercise induced  . SVT (supraventricular tachycardia) ([place])   . Tinnitus     Past Surgical History:  Procedure Laterality Date  . APPENDECTOMY  1958  . INGUINAL HERNIA REPAIR  04/2010   left  . INGUINAL HERNIA REPAIR  04/2001   right  . IXL   left  . TONSILLECTOMY  1962     Current Outpatient Prescriptions  Medication Sig Dispense Refill  . aspirin 81 MG tablet Take 81 mg by mouth daily.    Marland Kitchen atenolol (TENORMIN) 25 MG tablet Take 1 tablet (25 mg total) by mouth daily. 90 tablet 3  . Biotin 5 MG CAPS Take 5 mg by mouth daily.    . Multiple Vitamin (MULTIVITAMIN) capsule Take 1 capsule by mouth daily. Centrum Silver for Men     No current facility-administered medications for this visit.     Allergies:   No Known Allergies  Social History:  The patient  reports that he has never smoked. He has never used smokeless tobacco. He reports that he does not drink alcohol or use drugs.   Family History  Problem Relation Age of Onset  . Prostate cancer Father   . Aneurysm Father   . Arthritis/Rheumatoid Mother   . Leukemia Sister   . Chronic fatigue Brother   . Colon cancer Neg Hx    no early family history of CAD  ROS:  Please see the history of present illness.   Denies any bleeding, syncope, orthopnea, PND   All other systems reviewed and negative.   PHYSICAL EXAM: VS:  BP 120/82   Pulse (!) 52   Ht 5\' 9"  (1.753 m)   Wt 152 lb (68.9 kg)   BMI 22.45 kg/m  Well nourished, well developed, in  no acute distress  HEENT: normal, /AT, EOMI Neck: no JVD, normal carotid upstroke, no bruit Cardiac:  normal S1, S2; bradycardic regular; no murmur  Lungs:  clear to auscultation bilaterally, no wheezing, rhonchi or rales  Abd: soft, nontender, no hepatomegaly, no bruits  Ext: no edema, 2+ distal pulses Skin: warm and dry  GU: deferred Neuro: no focal abnormalities noted, AAO x 3 No significant change in physical exam from last year.  EKG:  Today ordered 08/02/16-sinus bradycardia rate 53 with incomplete right bundle branch block no other changes personally viewed-prior 07/26/15 sinus bradycardia rate 49, right bundle branch block incomplete with no other significant abnormalities.  01/14/14-sinus bradycardia rate 45, incomplete right bundle branch block  pattern    ECHO: 03/18/12: Normal EF, mild MR, normal LV size.   ETT: 03/18/12: 12 min exercise, brief SVT at peak, no ST changes.   ASSESSMENT AND PLAN:  1. SVT/palpitations - he has been doing very well with his diet, exercise. He has not felt any significant palpitations in quite some time. His atenolol is low-dose 25 mg and seems to be doing a great job. He knows to try to avoid excessive caffeine and education such as Sudafed. He is considering taking tumor rec as an anti-inflammatory because of generalized body aches at times. I asked him to be careful with combination of tumor rec and aspirin and especially tumor can ibuprofen as this can increase bleeding risks. He may end up taking his aspirin twice a week.  2. Bradycardia-decreased atenolol to 25 mg once a day and this seems to be controlling his heart rate quite well. No significant bradycardia on EKG. No syncope. No decreased exercise tolerance. 3. 12 month followup  Signed, Candee Furbish, MD [facility]  08/02/2016 9:27 AM

## 2016-08-02 NOTE — Patient Instructions (Signed)
Medication Instructions:  You may decrease ASA to twice a week if taking Tumeric. Continue all other medications as listed.  Follow-Up: Follow up in 1 year with Dr. Marlou Porch.  You will receive a letter in the mail 2 months before you are due.  Please call us when you receive this letter to schedule your follow up appointment.  If you need a refill on your cardiac medications before your next appointment, please call your pharmacy.  Thank you for choosing [facility]!!

## 2016-08-07 DIAGNOSIS — R69 Illness, unspecified: Secondary | ICD-10-CM | POA: Diagnosis not present

## 2017-02-08 DIAGNOSIS — R69 Illness, unspecified: Secondary | ICD-10-CM | POA: Diagnosis not present

## 2017-04-06 DIAGNOSIS — R69 Illness, unspecified: Secondary | ICD-10-CM | POA: Diagnosis not present

## 2017-04-26 DIAGNOSIS — Z87891 Personal history of nicotine dependence: Secondary | ICD-10-CM | POA: Diagnosis not present

## 2017-04-26 DIAGNOSIS — H9313 Tinnitus, bilateral: Secondary | ICD-10-CM | POA: Diagnosis not present

## 2017-04-26 DIAGNOSIS — Z7982 Long term (current) use of aspirin: Secondary | ICD-10-CM | POA: Diagnosis not present

## 2017-04-26 DIAGNOSIS — M4726 Other spondylosis with radiculopathy, lumbar region: Secondary | ICD-10-CM | POA: Diagnosis not present

## 2017-04-26 DIAGNOSIS — Z6822 Body mass index (BMI) 22.0-22.9, adult: Secondary | ICD-10-CM | POA: Diagnosis not present

## 2017-04-26 DIAGNOSIS — Z0000 Encounter for general adult medical examination without abnormal findings: Secondary | ICD-10-CM | POA: Diagnosis not present

## 2017-04-26 DIAGNOSIS — I471 Supraventricular tachycardia: Secondary | ICD-10-CM | POA: Diagnosis not present

## 2017-06-30 DIAGNOSIS — M25551 Pain in right hip: Secondary | ICD-10-CM | POA: Diagnosis not present

## 2017-07-03 ENCOUNTER — Telehealth: Payer: Self-pay | Admitting: Cardiology

## 2017-07-03 NOTE — Telephone Encounter (Signed)
Advised pt generally it is not recommended that pt's take these two medications together.  Pt wants a direct recommendation from Dr Marlou Porch.  Advised I will forward this to him for review and c/b.

## 2017-07-03 NOTE — Telephone Encounter (Signed)
New message    Pt c/o medication issue:  1. Name of Medication: Indomethacin 25mg  and atenolol (TENORMIN) 25 MG tablet  2. How are you currently taking this medication (dosage and times per day)? As prescribed  3. Are you having a reaction (difficulty breathing--STAT)? NO  4. What is your medication issue? Patient wants to verify if he can take medication together

## 2017-07-03 NOTE — Telephone Encounter (Signed)
Called and spoke with pt who is aware Dr Marlou Porch advised to use indomethacin sparingly.  Pt states the orthopedist wants him to take this medication on a daily basis.  Advised pt should review other possible medication options with his orthopedist.  He states understanding.

## 2017-07-03 NOTE — Telephone Encounter (Signed)
NSAIDs may diminish the anti hypertensive effects of atenolol. I would suggest to use indomethacin sparingly.  Candee Furbish, MD

## 2017-07-05 ENCOUNTER — Telehealth: Payer: Self-pay | Admitting: Cardiology

## 2017-07-05 NOTE — Telephone Encounter (Signed)
New Message   Pt c/o medication issue:  1. Name of Medication: Meloxicam    2. How are you currently taking this medication (dosage and times per day)? 15mg  once a day  3. Are you having a reaction (difficulty breathing--STAT)? No   4. What is your medication issue? Patient was just prescribed meloxicam and he would like to know would it interact with his atenolol.

## 2017-07-05 NOTE — Telephone Encounter (Addendum)
Pt calls in concerned for taking Atenolol and Meloxicam.  Reviewed with pharmacist. Advised patient that NSAIDs may diminish the antihypertensive effects of atenolol.  Upon review of chart and further discussion w/ patient, he is taking atenolol for HR control for his PSVT and not for blood pressure. Pt reports that his orthopedic recommended he take Meloxicam daily to see if improvement and if so he will prescribe pt to take it daily. Advised that he should monitor his HRs & BPs while taking Meloxicam and call us if either begins to elevate.  Will forward to Dr. Marlou Porch for his advisement and if he has any recommendation/change to make. Patient understands office will call him back if Dr. Marlou Porch has any recommendation, otherwise keep f/u appt next month. Patient verbalized understanding and agreeable to plan.

## 2017-07-11 ENCOUNTER — Other Ambulatory Visit: Payer: Self-pay | Admitting: Cardiology

## 2017-07-26 DIAGNOSIS — M25551 Pain in right hip: Secondary | ICD-10-CM | POA: Diagnosis not present

## 2017-08-09 ENCOUNTER — Encounter: Payer: Self-pay | Admitting: Cardiology

## 2017-08-09 ENCOUNTER — Ambulatory Visit: Payer: Medicare HMO | Admitting: Cardiology

## 2017-08-09 ENCOUNTER — Other Ambulatory Visit: Payer: Self-pay | Admitting: Cardiology

## 2017-08-09 VITALS — BP 110/86 | HR 60 | Ht 69.0 in | Wt 157.4 lb

## 2017-08-09 DIAGNOSIS — M79604 Pain in right leg: Secondary | ICD-10-CM | POA: Diagnosis not present

## 2017-08-09 DIAGNOSIS — I471 Supraventricular tachycardia: Secondary | ICD-10-CM | POA: Diagnosis not present

## 2017-08-09 DIAGNOSIS — M79605 Pain in left leg: Secondary | ICD-10-CM

## 2017-08-09 DIAGNOSIS — R002 Palpitations: Secondary | ICD-10-CM

## 2017-08-09 DIAGNOSIS — R001 Bradycardia, unspecified: Secondary | ICD-10-CM

## 2017-08-09 NOTE — Progress Notes (Signed)
[facility]. [address], [address] [place], [place]  [address] Phone: [phone] Fax:  [phone]  Date:  08/09/2017   ID:  Jack Huerta, DOB 26-Mar-1946, MRN [id number]  PCP:  Gaynelle Arabian, MD   History of Present Illness: Jack Huerta is a 72 y.o. male with atrial fibrillation/paroxysmal SVT, palpitations former patient of Dr. Gilman Schmidt, previous echo within normal limits, ETT demonstrating SVT in the past, up to180 bpm on treadmill.   We had issues with cost of Bystolic in the past, change because of fatigue on atenolol. He actually went back to the atenolol and felt better. He has increased anxiety. He had a treadmill test in October of 2013 where he ran for 12 minutes and 43 seconds, excellent with no ST segment changes. Immediately after stopping the treadmill he had a brief episode of SVT with heart rate increasing to approximately 180 beats per minute transiently. No evidence of prolonged SVT or atrial fibrillation.   Previous monitor showed frequent PACs up to 16,000. Echocardiogram showed once again normal EF with mild mitral regurgitation.  Overall he has been doing quite well exercising well, no chest pain, no shortness of breath, no syncope, no fevers. No change in endurance. Weight stable. See below for further discussion.  08/09/17  - leg pain, feet feel strange when standing for long periods. Pain runs down buttock R >L with sometime going to front of lower leg.   - rate palps, skip - one time.   - Asked about anti-inflammatory again.   - No CP, noSOB, no F/V/C  Wt Readings from Last 3 Encounters:  08/09/17 157 lb 6.4 oz (71.4 kg)  08/02/16 152 lb (68.9 kg)  07/26/15 152 lb 12.8 oz (69.3 kg)     Past Medical History:  Diagnosis Date  . Chicken pox   . False positive serological test for hepatitis C   . [place] measles   . Hemorrhoids   . Mumps   . SVT (supraventricular tachycardia) ([place]) 1993   exercise induced  . SVT (supraventricular tachycardia)  ([place])   . Tinnitus     Past Surgical History:  Procedure Laterality Date  . APPENDECTOMY  1958  . INGUINAL HERNIA REPAIR  04/2010   left  . INGUINAL HERNIA REPAIR  04/2001   right  . [place]   left  . TONSILLECTOMY  1962    Current Outpatient Medications  Medication Sig Dispense Refill  . aspirin 81 MG tablet Take 81 mg by mouth daily.    Marland Kitchen atenolol (TENORMIN) 25 MG tablet TAKE 1 TABLET BY MOUTH DAILY 90 tablet 0  . Biotin 5 MG CAPS Take 5 mg by mouth daily.    . Multiple Vitamin (MULTIVITAMIN) capsule Take 1 capsule by mouth daily. Centrum Silver for Men     No current facility-administered medications for this visit.     Allergies:   No Known Allergies  Social History:  The patient  reports that he has never smoked. He has never used smokeless tobacco. He reports that he does not drink alcohol or use drugs.   Family History  Problem Relation Age of Onset  . Prostate cancer Father   . Aneurysm Father   . Arthritis/Rheumatoid Mother   . Leukemia Sister   . Chronic fatigue Brother   . Colon cancer Neg Hx    no early family history of CAD  ROS:  Please see the history of present illness.  All others  ROS neg.   PHYSICAL EXAM: VS:  BP 110/86   Pulse 60   Ht 5\' 9"  (1.753 m)   Wt 157 lb 6.4 oz (71.4 kg)   BMI 23.24 kg/m  GEN: Well nourished, well developed, in no acute distress  HEENT: normal  Neck: no JVD, carotid bruits, or masses Cardiac: RRR; no murmurs, rubs, or gallops,no edema  Respiratory:  clear to auscultation bilaterally, normal work of breathing GI: soft, nontender, nondistended, + BS MS: no deformity or atrophy, pulses in feet seem to be OK.  Skin: warm and dry, no rash Neuro:  Alert and Oriented x 3, Strength and sensation are intact Psych: euthymic mood, full affect   EKG:  Today ordered 08/09/17 - NSR, NSSTW changes, IRBBB. 08/02/16-sinus bradycardia rate 53 with incomplete right bundle branch block no other changes personally  viewed-prior 07/26/15 sinus bradycardia rate 49, right bundle branch block incomplete with no other significant abnormalities.  01/14/14-sinus bradycardia rate 45, incomplete right bundle branch block pattern    ECHO: 03/18/12: Normal EF, mild MR, normal LV size.   ETT: 03/18/12: 12 min exercise, brief SVT at peak, no ST changes.   ASSESSMENT AND PLAN:  1. SVT/palpitations -excellent with atenolol. No changes.  2. Bradycardia-decreased atenolol to 25 mg once a day and this seems to be controlling his heart rate quite well. No significant bradycardia on EKG. No syncope. No decreased exercise tolerance. Getting 120 on treadmill. Doing well no change.  3. Leg pain - LE ABI's. Likely neuropathic. Wants to keep trying diet. Tumeric discussed. Took NSAID for a while.  4. 12 month followup  Signed, Candee Furbish, MD [facility]  08/09/2017 8:43 AM

## 2017-08-09 NOTE — Patient Instructions (Signed)
  Medication Instructions:  The current medical regimen is effective;  continue present plan and medications.  Testing/Procedures: Your physician has requested that you have a lower extremity arterial exercise duplex. During this test, exercise and ultrasound are used to evaluate arterial blood flow in the legs. Allow one hour for this exam. There are no restrictions or special instructions.  Follow-Up: Follow up in 1 year with Dr. Skains.  You will receive a letter in the mail 2 months before you are due.  Please call us when you receive this letter to schedule your follow up appointment.  If you need a refill on your cardiac medications before your next appointment, please call your pharmacy.  Thank you for choosing [facility]!!     

## 2017-08-13 DIAGNOSIS — R69 Illness, unspecified: Secondary | ICD-10-CM | POA: Diagnosis not present

## 2017-08-20 ENCOUNTER — Ambulatory Visit (HOSPITAL_COMMUNITY)
Admission: RE | Admit: 2017-08-20 | Discharge: 2017-08-20 | Disposition: A | Discharge status: Home / Self Care | Payer: Medicare HMO | Source: Ambulatory Visit | Attending: Cardiology | Admitting: Cardiology

## 2017-08-20 DIAGNOSIS — M79605 Pain in left leg: Secondary | ICD-10-CM | POA: Diagnosis not present

## 2017-08-20 DIAGNOSIS — E785 Hyperlipidemia, unspecified: Secondary | ICD-10-CM | POA: Diagnosis not present

## 2017-08-20 DIAGNOSIS — M79604 Pain in right leg: Secondary | ICD-10-CM | POA: Diagnosis not present

## 2017-09-12 DIAGNOSIS — R202 Paresthesia of skin: Secondary | ICD-10-CM | POA: Diagnosis not present

## 2017-09-12 DIAGNOSIS — D225 Melanocytic nevi of trunk: Secondary | ICD-10-CM | POA: Diagnosis not present

## 2017-09-12 DIAGNOSIS — L570 Actinic keratosis: Secondary | ICD-10-CM | POA: Diagnosis not present

## 2017-09-12 DIAGNOSIS — Z85828 Personal history of other malignant neoplasm of skin: Secondary | ICD-10-CM | POA: Diagnosis not present

## 2017-09-12 DIAGNOSIS — L820 Inflamed seborrheic keratosis: Secondary | ICD-10-CM | POA: Diagnosis not present

## 2017-09-12 DIAGNOSIS — D1801 Hemangioma of skin and subcutaneous tissue: Secondary | ICD-10-CM | POA: Diagnosis not present

## 2017-09-12 DIAGNOSIS — L821 Other seborrheic keratosis: Secondary | ICD-10-CM | POA: Diagnosis not present

## 2017-09-12 DIAGNOSIS — L814 Other melanin hyperpigmentation: Secondary | ICD-10-CM | POA: Diagnosis not present

## 2017-10-03 ENCOUNTER — Other Ambulatory Visit: Payer: Self-pay | Admitting: Cardiology

## 2017-12-20 DIAGNOSIS — Z0000 Encounter for general adult medical examination without abnormal findings: Secondary | ICD-10-CM | POA: Diagnosis not present

## 2017-12-20 DIAGNOSIS — M2560 Stiffness of unspecified joint, not elsewhere classified: Secondary | ICD-10-CM | POA: Diagnosis not present

## 2017-12-20 DIAGNOSIS — R5383 Other fatigue: Secondary | ICD-10-CM | POA: Diagnosis not present

## 2017-12-20 DIAGNOSIS — Z131 Encounter for screening for diabetes mellitus: Secondary | ICD-10-CM | POA: Diagnosis not present

## 2017-12-20 DIAGNOSIS — E7800 Pure hypercholesterolemia, unspecified: Secondary | ICD-10-CM | POA: Diagnosis not present

## 2017-12-20 DIAGNOSIS — I471 Supraventricular tachycardia: Secondary | ICD-10-CM | POA: Diagnosis not present

## 2018-04-02 DIAGNOSIS — R69 Illness, unspecified: Secondary | ICD-10-CM | POA: Diagnosis not present

## 2018-04-29 ENCOUNTER — Ambulatory Visit (INDEPENDENT_AMBULATORY_CARE_PROVIDER_SITE_OTHER): Payer: Medicare HMO | Admitting: Family Medicine

## 2018-04-29 ENCOUNTER — Encounter: Payer: Self-pay | Admitting: Family Medicine

## 2018-04-29 VITALS — BP 110/78 | HR 68 | Temp 98.6°F | Ht 69.0 in | Wt 138.0 lb

## 2018-04-29 DIAGNOSIS — M25551 Pain in right hip: Secondary | ICD-10-CM

## 2018-04-29 DIAGNOSIS — Z23 Encounter for immunization: Secondary | ICD-10-CM | POA: Diagnosis not present

## 2018-04-29 DIAGNOSIS — R6889 Other general symptoms and signs: Secondary | ICD-10-CM

## 2018-04-29 DIAGNOSIS — R252 Cramp and spasm: Secondary | ICD-10-CM

## 2018-04-29 DIAGNOSIS — G8929 Other chronic pain: Secondary | ICD-10-CM

## 2018-04-29 DIAGNOSIS — Z13220 Encounter for screening for lipoid disorders: Secondary | ICD-10-CM | POA: Diagnosis not present

## 2018-04-29 DIAGNOSIS — Z1159 Encounter for screening for other viral diseases: Secondary | ICD-10-CM | POA: Diagnosis not present

## 2018-04-29 MED ORDER — TETANUS-DIPHTH-ACELL PERTUSSIS 5-2.5-18.5 LF-MCG/0.5 IM SUSP: 0.5000 mL | Freq: Once | INTRAMUSCULAR | 0 refills | Status: AC

## 2018-04-29 NOTE — Progress Notes (Signed)
Jack Huerta is a 72 y.o. male is here to [facility].   Patient Care Team: [name], DO as PCP - General (Family Medicine)   History of Present Illness:   HPI: Goes to [employer]. Active, generally healthy. Marcus for chronic right hip pain. Lumbar MRI reported as normal. Prefers to avoid medication if possible. Pain in bilateral legs, intermittent. No trauma or overuse.   Health Maintenance Due  Topic Date Due  . TETANUS/TDAP  12/01/1962  . PNA vac Low Risk Adult (2 of 2 - PPSV23) 06/20/2014   No flowsheet data found.  PMHx, SurgHx, SocialHx, Medications, and Allergies were reviewed in the Visit Navigator and updated as appropriate.   Past Medical History:  Diagnosis Date  . Chicken pox   . False positive serological test for hepatitis C   . [place] measles   . Hemorrhoids   . Mumps   . SVT (supraventricular tachycardia) ([place]), exercise induced 1993  . Tinnitus      Past Surgical History:  Procedure Laterality Date  . APPENDECTOMY  1958  . INGUINAL HERNIA REPAIR Left 04/2010  . INGUINAL HERNIA REPAIR Right 04/2001  . INGUINAL HERNIA REPAIR Left 1973  . TONSILLECTOMY  1962     Family History  Problem Relation Age of Onset  . Prostate cancer Father   . Aneurysm Father   . Arthritis/Rheumatoid Mother   . Leukemia Sister   . Chronic fatigue Brother   . Colon cancer Neg Hx     Social History   Tobacco Use  . Smoking status: Never Smoker  . Smokeless tobacco: Never Used  Substance Use Topics  . Alcohol use: No  . Drug use: No   Current Medications and Allergies:   .  aspirin 81 MG tablet, Take 81 mg by mouth daily., Disp: , Rfl:  .  atenolol (TENORMIN) 25 MG tablet, TAKE ONE TABLET BY MOUTH ONE TIME DAILY , Disp: 90 tablet, Rfl: 3 .  Biotin 5 MG CAPS, Take 5 mg by mouth daily., Disp: , Rfl:   No Known Allergies   Review of Systems:   Pertinent items are noted in the HPI. Otherwise, a complete ROS is negative.  Vitals:     Vitals:   04/29/18 1339  BP: 110/78  Pulse: 68  Temp: 98.6 F (37 C)  TempSrc: Oral  SpO2: 98%  Weight: 138 lb (62.6 kg)  Height: 5\' 9"  (1.753 m)     Body mass index is 20.38 kg/m.  Physical Exam:   Physical Exam Vitals signs and nursing note reviewed.  Constitutional:      General: He is not in acute distress.    Appearance: He is well-developed.  HENT:     Head: Normocephalic and atraumatic.     Right Ear: External ear normal.     Left Ear: External ear normal.     Nose: Nose normal.  Eyes:     Conjunctiva/sclera: Conjunctivae normal.     Pupils: Pupils are equal, round, and reactive to light.  Neck:     Musculoskeletal: Neck supple.  Cardiovascular:     Rate and Rhythm: Normal rate and regular rhythm.  Pulmonary:     Effort: Pulmonary effort is normal.  Abdominal:     General: Bowel sounds are normal.     Palpations: Abdomen is soft.  Musculoskeletal: Normal range of motion.  Skin:    General: Skin is warm.  Neurological:     Mental Status: He is alert.  Psychiatric:        Behavior: Behavior normal.    Results for orders placed or performed in visit on 04/29/18  CBC with Differential/Platelet  Result Value Ref Range   WBC 4.6 4.0 - 10.5 K/uL   RBC 4.48 4.22 - 5.81 Mil/uL   Hemoglobin 14.6 13.0 - 17.0 g/dL   HCT 43.0 39.0 - 52.0 %   MCV 96.0 78.0 - 100.0 fl   MCHC 33.9 30.0 - 36.0 g/dL   RDW 13.1 11.5 - 15.5 %   Platelets 205.0 150.0 - 400.0 K/uL   Neutrophils Relative % 72.9 43.0 - 77.0 %   Lymphocytes Relative 14.0 12.0 - 46.0 %   Monocytes Relative 11.5 3.0 - 12.0 %   Eosinophils Relative 0.5 0.0 - 5.0 %   Basophils Relative 1.1 0.0 - 3.0 %   Neutro Abs 3.4 1.4 - 7.7 K/uL   Lymphs Abs 0.6 (L) 0.7 - 4.0 K/uL   Monocytes Absolute 0.5 0.1 - 1.0 K/uL   Eosinophils Absolute 0.0 0.0 - 0.7 K/uL   Basophils Absolute 0.0 0.0 - 0.1 K/uL  Comprehensive metabolic panel  Result Value Ref Range   Sodium 134 (L) 135 - 145 mEq/L   Potassium 4.7 3.5 -  5.1 mEq/L   Chloride 95 (L) 96 - 112 mEq/L   CO2 32 19 - 32 mEq/L   Glucose, Bld 88 70 - 99 mg/dL   BUN 20 6 - 23 mg/dL   Creatinine, Ser 0.82 0.40 - 1.50 mg/dL   Total Bilirubin 0.5 0.2 - 1.2 mg/dL   Alkaline Phosphatase 58 39 - 117 U/L   AST 30 0 - 37 U/L   ALT 27 0 - 53 U/L   Total Protein 6.8 6.0 - 8.3 g/dL   Albumin 4.2 3.5 - 5.2 g/dL   Calcium 9.3 8.4 - 10.5 mg/dL   GFR 97.50 >60.00 mL/min  TSH  Result Value Ref Range   TSH 1.69 0.35 - 4.50 uIU/mL  T4, free  Result Value Ref Range   Free T4 0.79 0.60 - 1.60 ng/dL  Lipid panel  Result Value Ref Range   Cholesterol 182 0 - 200 mg/dL   Triglycerides 41.0 0.0 - 149.0 mg/dL   HDL 77.90 >39.00 mg/dL   VLDL 8.2 0.0 - 40.0 mg/dL   LDL Cholesterol 96 0 - 99 mg/dL   Total CHOL/HDL Ratio 2    NonHDL 104.45   Hepatitis C antibody  Result Value Ref Range   Hepatitis C Ab NON-REACTIVE NON-REACTI   SIGNAL TO CUT-OFF 0.09 <1.00  T3, free  Result Value Ref Range   T3, Free 2.5 2.3 - 4.2 pg/mL  CK  Result Value Ref Range   Total CK 355 (H) 7 - 232 U/L  CRP High sensitivity  Result Value Ref Range   CRP, High Sensitivity 3.400 0.000 - 5.000 mg/L  Sedimentation rate  Result Value Ref Range   Sed Rate 8 0 - 20 mm/hr   Assessment and Plan:   Aurelio was seen today for establish care.  Diagnoses and all orders for this visit:  Chronic right hip pain -     Ambulatory referral to Sports Medicine -     CRP High sensitivity -     Sedimentation rate  Leg cramps -     CBC with Differential/Platelet -     Comprehensive metabolic panel -     Lipid panel -     T3, free -     CK  Cold intolerance -     TSH -     T4, free  Need for hepatitis C screening test -     Hepatitis C antibody  Need for Tdap vaccination -     Tdap (BOOSTRIX) 5-2.5-18.5 LF-MCG/0.5 injection; Inject 0.5 mLs into the muscle once for 1 dose.  Screening for lipid disorders -     Lipid panel   . Orders and follow up as documented in [place],  reviewed diet, exercise and weight control, cardiovascular risk and specific lipid/LDL goals reviewed, reviewed medications and side effects in detail.  . Reviewed expectations re: course of current medical issues. . Outlined signs and symptoms indicating need for more acute intervention. . Patient verbalized understanding and all questions were answered. . Patient received an After Visit Summary.  [name], DO [place] 05/08/2018  Records requested if needed. Time spent with the patient: 45 minutes, of which >50% was spent in obtaining information about his symptoms, reviewing his previous labs, evaluations, and treatments, counseling him about his condition (please see the discussed topics above), and developing a plan to further investigate it; he had a number of questions which I addressed.

## 2018-04-30 ENCOUNTER — Ambulatory Visit: Payer: Medicare HMO | Admitting: Sports Medicine

## 2018-04-30 LAB — HEPATITIS C ANTIBODY
Hepatitis C Ab: NONREACTIVE
SIGNAL TO CUT-OFF: 0.09 (ref <1.00)

## 2018-04-30 LAB — HIGH SENSITIVITY CRP: CRP, High Sensitivity: 3.4 mg/L (ref 0.000–5.000)

## 2018-04-30 LAB — CBC WITH DIFFERENTIAL/PLATELET
Basophils Absolute: 0 10*3/uL (ref 0.0–0.1)
Basophils Relative: 1.1 % (ref 0.0–3.0)
Eosinophils Absolute: 0 10*3/uL (ref 0.0–0.7)
Eosinophils Relative: 0.5 % (ref 0.0–5.0)
HCT: 43 % (ref 39.0–52.0)
Hemoglobin: 14.6 g/dL (ref 13.0–17.0)
Lymphocytes Relative: 14 % (ref 12.0–46.0)
Lymphs Abs: 0.6 10*3/uL — ABNORMAL LOW (ref 0.7–4.0)
MCHC: 33.9 g/dL (ref 30.0–36.0)
MCV: 96 fl (ref 78.0–100.0)
Monocytes Absolute: 0.5 10*3/uL (ref 0.1–1.0)
Monocytes Relative: 11.5 % (ref 3.0–12.0)
Neutro Abs: 3.4 10*3/uL (ref 1.4–7.7)
Neutrophils Relative %: 72.9 % (ref 43.0–77.0)
Platelets: 205 10*3/uL (ref 150.0–400.0)
RBC: 4.48 Mil/uL (ref 4.22–5.81)
RDW: 13.1 % (ref 11.5–15.5)
WBC: 4.6 10*3/uL (ref 4.0–10.5)

## 2018-04-30 LAB — COMPREHENSIVE METABOLIC PANEL
ALT: 27 U/L (ref 0–53)
AST: 30 U/L (ref 0–37)
Albumin: 4.2 g/dL (ref 3.5–5.2)
Alkaline Phosphatase: 58 U/L (ref 39–117)
BUN: 20 mg/dL (ref 6–23)
CO2: 32 mEq/L (ref 19–32)
Calcium: 9.3 mg/dL (ref 8.4–10.5)
Chloride: 95 mEq/L — ABNORMAL LOW (ref 96–112)
Creatinine, Ser: 0.82 mg/dL (ref 0.40–1.50)
GFR: 97.5 mL/min (ref >60.00)
Glucose, Bld: 88 mg/dL (ref 70–99)
Potassium: 4.7 mEq/L (ref 3.5–5.1)
Sodium: 134 mEq/L — ABNORMAL LOW (ref 135–145)
Total Bilirubin: 0.5 mg/dL (ref 0.2–1.2)
Total Protein: 6.8 g/dL (ref 6.0–8.3)

## 2018-04-30 LAB — LIPID PANEL
Cholesterol: 182 mg/dL (ref 0–200)
HDL: 77.9 mg/dL (ref >39.00)
LDL Cholesterol: 96 mg/dL (ref 0–99)
NonHDL: 104.45
Total CHOL/HDL Ratio: 2
Triglycerides: 41 mg/dL (ref 0.0–149.0)
VLDL: 8.2 mg/dL (ref 0.0–40.0)

## 2018-04-30 LAB — T3, FREE: T3, Free: 2.5 pg/mL (ref 2.3–4.2)

## 2018-04-30 LAB — CK: Total CK: 355 U/L — ABNORMAL HIGH (ref 7–232)

## 2018-04-30 LAB — TSH: TSH: 1.69 u[IU]/mL (ref 0.35–4.50)

## 2018-04-30 LAB — SEDIMENTATION RATE: Sed Rate: 8 mm/hr (ref 0–20)

## 2018-04-30 LAB — T4, FREE: Free T4: 0.79 ng/dL (ref 0.60–1.60)

## 2018-05-07 ENCOUNTER — Encounter: Payer: Self-pay | Admitting: Sports Medicine

## 2018-05-07 ENCOUNTER — Ambulatory Visit: Payer: Medicare HMO | Admitting: Sports Medicine

## 2018-05-07 VITALS — BP 104/70 | HR 59 | Ht 69.0 in | Wt 136.2 lb

## 2018-05-07 DIAGNOSIS — M9906 Segmental and somatic dysfunction of lower extremity: Secondary | ICD-10-CM | POA: Diagnosis not present

## 2018-05-07 DIAGNOSIS — G8929 Other chronic pain: Secondary | ICD-10-CM

## 2018-05-07 DIAGNOSIS — M9904 Segmental and somatic dysfunction of sacral region: Secondary | ICD-10-CM | POA: Diagnosis not present

## 2018-05-07 DIAGNOSIS — M25551 Pain in right hip: Secondary | ICD-10-CM

## 2018-05-07 DIAGNOSIS — R252 Cramp and spasm: Secondary | ICD-10-CM | POA: Diagnosis not present

## 2018-05-07 DIAGNOSIS — M9905 Segmental and somatic dysfunction of pelvic region: Secondary | ICD-10-CM | POA: Diagnosis not present

## 2018-05-07 DIAGNOSIS — M24551 Contracture, right hip: Secondary | ICD-10-CM

## 2018-05-07 MED ORDER — GABAPENTIN 100 MG PO CAPS: ORAL_CAPSULE | ORAL | 1 refills | Status: DC

## 2018-05-07 NOTE — Progress Notes (Signed)
PROCEDURE NOTE: THERAPEUTIC EXERCISES (97110) 15 minutes spent for Therapeutic exercises as below and as referenced in the AVS.  This included exercises focusing on stretching, strengthening, with significant focus on eccentric aspects.   Proper technique shown and discussed handout in great detail with ATC.  All questions were discussed and answered.   Long term goals include an improvement in range of motion, strength, endurance as well as avoiding reinjury. Frequency of visits is one time as determined during today's  office visit. Frequency of exercises to be performed is as per handout.  EXERCISES REVIEWED: Thoracic Mobility Pelvic recruitment/tilting Hip ABduction strengthening with focus on Oasis Recruitment

## 2018-05-07 NOTE — Patient Instructions (Addendum)
Also check out [employer]" which is a program developed by Dr. Minerva Ends.   There are links to a couple of his YouTube Videos below and I would like to see you performing one of his videos 5-6 days per week.  It is best to do these exercises first thing in the morning.  They will give you a good jumpstart here today and start normalizing the way you move.  A good intro video is: "Independence from Pain 7-minute Video" - [email or web address]   A more advanced video is: Interior and spatial designer original 12 minutes" - [email or web address]  Exercises that focus more on the neck are as below: Dr. Archie Balboa with [facility] teaching neck and shoulder details Part 1 - https://youtu.be/[email or web address] Part 2 Dr. Archie Balboa with [facility] quick routine to practice daily - https://youtu.be/[email or web address]  Do not try to attempt the entire video when first beginning.  Try breaking of each exercise that he goes into shorter segments.  In other words, if they perform an exercise for 45 seconds, start with 15 seconds and rest and then resume when they begin the new activity.  If you work your way up to being able to do these videos without having to stop, I expect you will see significant improvements in your pain.  If you enjoy his videos and would like to find out more you can look on his website: [email or web address]  He has a workout streaming option as well as a DVD set available for purchase.  Amazon has the best price for his DVDs.     Please perform the exercise program that we have prepared for you and gone over in detail on a daily basis.  In addition to the handout you were provided you can access your program through: [email or web address]   Your unique program code is: [email or web address]

## 2018-05-07 NOTE — Progress Notes (Signed)
Juanda Bond. , [place] at [place]  EMRAH ARIOLA - 72 y.o. male MRN [id number]  Date of birth: May 13, 1946  Visit Date: 05/07/2018   PCP: [name], DO   Referred by: [name], DO   SUBJECTIVE:  Chief Complaint  Patient presents with  . New Patient (Initial Visit)    Sciatica / R hip pain    HPI: Patient presents for evaluation of right hip pain.  Pain began in the right thigh and progressed into the groin over the past several years.  Initial symptoms began in fall 2016.  He was seen at [place] [facility] in February 2019 and diagnosed with right hip osteoarthritis.  He was started on NSAIDs and prednisone.  He has a constant ache that tends to get sharp.  Right thigh pain tends to go away but he is now having left buttock and hip pain with radiation into the thigh that did not change following the injection.  He does have pain that radiates into the right lateral lower extremity from the thigh and lower leg.  Causes burning in his right foot and causes his foot want to curl and inversion.  This is adding stress to his hip.  He has taken NSAIDs and prednisone with mild improvement.  He is changed his diet to help reduce inflammation.  REVIEW OF SYSTEMS: Denies night time disturbances. Denies fevers, chills, or night sweats. Denies unexplained weight loss. Denies personal history of cancer. Denies changes in bowel or bladder habits. Denies recent unreported falls. Denies new or worsening dyspnea or wheezing. Denies headaches or dizziness.  Denies numbness, tingling or weakness  In the extremities.  Denies dizziness or presyncopal episodes Denies lower extremity edema   HISTORY:  Prior history reviewed and updated per electronic medical record.  Social History   Occupational History    Employer: RETIRED  Tobacco Use  . Smoking status: Never Smoker  . Smokeless tobacco: Never Used  Substance and Sexual  Activity  . Alcohol use: No  . Drug use: No  . Sexual activity: Not on file   Social History   Social History Narrative  . Not on file    Past Medical History:  Diagnosis Date  . Chicken pox   . False positive serological test for hepatitis C   . [place] measles   . Hemorrhoids   . Mumps   . SVT (supraventricular tachycardia) ([place]), exercise induced 1993  . Tinnitus    Past Surgical History:  Procedure Laterality Date  . APPENDECTOMY  1958  . INGUINAL HERNIA REPAIR Left 04/2010  . INGUINAL HERNIA REPAIR Right 04/2001  . INGUINAL HERNIA REPAIR Left 1973  . TONSILLECTOMY  1962   family history includes Aneurysm in his father; Arthritis/Rheumatoid in his mother; Chronic fatigue in his brother; Leukemia in his sister; Prostate cancer in his father. There is no history of Colon cancer.  DATA OBTAINED & REVIEWED:  Recent Labs    04/29/18 1439 06/05/18 0950  CALCIUM 9.3 9.0  AST 30 17  ALT 27 18  TSH 1.69  --    No problems updated. No specialty comments available.  OBJECTIVE:  VS:  HT:5\' 9"  (175.3 cm)   WT:136 lb 3.2 oz (61.8 kg)  BMI:20.1    BP:104/70  HR:(!) 59bpm  TEMP: ( )  RESP:97 %   PHYSICAL EXAM: CONSTITUTIONAL: Well-developed, Well-nourished and In no acute distress PSYCHIATRIC : Alert & appropriately interactive. and Not depressed or anxious  appearing. RESPIRATORY : No increased work of breathing and Trachea Midline EYES : Pupils are equal., EOM intact without nystagmus. and No scleral icterus.  VASCULAR EXAM : Warm and well perfused NEURO: unremarkable  MSK Exam:  Right hip is overall well aligned.  He has tightness with straight leg raise as well as tightness with Stinchfield testing but his strength is well-preserved and he has no significant pain.  He does have pain with palpation of the gluteal muscles.  He has no significant limitations with internal rotation and external rotation from a pain standpoint but does have functional limitations.   His lower extremity strength is intact.  He is able to heel and toe walk without difficulty.     ASSESSMENT   1. Chronic right hip pain   2. Leg cramps   3. Right hip flexor tightness   4. Somatic dysfunction of lower extremity   5. Somatic dysfunction of pelvis region   6. Somatic dysfunction of sacral region     PLAN:  Pertinent additional documentation may be included in corresponding procedure notes, imaging studies, problem based documentation and patient instructions.  Procedures:  Home Therapeutic exercises prescribed per procedure note.   PROCEDURE NOTE : OSTEOPATHIC MANIPULATION The decision today to treat with Osteopathic Manipulative Therapy (OMT) was based on physical exam findings. Verbal consent was obtained following a discussion with the patient regarding the of risks, benefits and potential side effects, including an acute pain flare,post manipulation soreness and need for repeat treatments.    Contraindications to OMT: NONE  Manipulation was performed as below: [facility] Treated & Osteopathic Exam Findings  PELVIS:  Left psoas spasm Left anterior innonimate SACRUM:  R on R sacral torsion LOWER EXTREMITIES:  Left lower extremity is externally rotated.  He has a hip flexor contracture.   OMT Techniques Used  HVLA muscle energy myofascial release    The patient tolerated the treatment well and reported Improved symptoms following treatment today. Patient was given medications, exercises, stretches and lifestyle modifications per AVS and verbally.     Medications:  Meds ordered this encounter  Medications  . DISCONTD: gabapentin (NEURONTIN) 100 MG capsule    Sig: Start with 1 tab po qhs X 1 week, then increase to 1 tab po bid X 1 week then 1 tab po tid prn    Dispense:  90 capsule    Refill:  1    Discussion/Instructions: No problem-specific Assessment & Plan notes found for this encounter.  Discussed the underlying features of tight hip flexors  leading to crouched, fetal like position that results in spinal column compression.  Including lumbar hyperflexion with hypermobility, thoracic flexion with restrictive rotation and cervical lordosis reversal.   Osteopathic manipulation was performed today based on physical exam findings.  Patient was counseled on the purpose and expected outcome of osteopathic manipulation and understands that a single treatment may not provide permanent long lasting relief.  They understand that home therapeutic exercises are critical part of the healing/treatment process and will continue with self treatment between now and their next visit as outlined.  The patient understands that the frequency of visits is meant to provide a stimulus to promote the body's own ability to heal and is not meant to be the sole means for improvement in their symptoms.  He absolutely does have right hip osteoarthritis and some limitations are due to this however the rating seems to becoming more so from a functional discrepancy.  I like see how he responds to the  home exercise program as well as osteopathic manipulation  Given the possible radicular component we will start him on gabapentin to see if this is helpful to the leg cramps that he has  If any lack of improvement: consider further diagnostic evaluation with MRI pelvis  At follow up will plan: repeat osteopathic manipulation   Return in about 29 days (around 06/05/2018).          [name], [facility] Physician

## 2018-05-07 NOTE — Progress Notes (Deleted)
PROCEDURE NOTE : OSTEOPATHIC MANIPULATION The decision today to treat with Osteopathic Manipulative Therapy (OMT) was based on physical exam findings. Verbal consent was obtained following a discussion with the patient regarding the of risks, benefits and potential side effects, including an acute pain flare,post manipulation soreness and need for repeat treatments.     Contraindications to OMT: NONE  Manipulation was performed as below: [facility] Treated & Osteopathic Exam Findings  {PELVIS (Optional):21166} {SACRUM (Optional):21141} {LOWER EXTREMITIES (Optional):21780}   OMT Techniques Used  {OMT Techniques Used:21101::"HVLA","muscle energy","myofascial release"}    The patient tolerated the treatment well and reported {CHL AMB ORT SYMPTOMS POST TREATMENT:21798::"Improved"} symptoms following treatment today. Patient was given medications, exercises, stretches and lifestyle modifications per AVS and verbally.

## 2018-05-08 ENCOUNTER — Encounter: Payer: Self-pay | Admitting: Family Medicine

## 2018-05-09 NOTE — Telephone Encounter (Signed)
Added to another open message

## 2018-05-09 NOTE — Telephone Encounter (Signed)
Added from another message as well   have a question about TSH resulted on 04/30/18, 11:20 AM.    Based on my cold intolerance, what does my labs indicate about my thyroid?

## 2018-05-09 NOTE — Telephone Encounter (Signed)
Added from another open message.  I have a question about HIGH SENSITIVITY CRP resulted on 04/30/18, 12:03 PM.    On my goal of reducing inflammation, what does my labs indicate?

## 2018-06-03 ENCOUNTER — Ambulatory Visit (INDEPENDENT_AMBULATORY_CARE_PROVIDER_SITE_OTHER): Payer: Medicare HMO

## 2018-06-03 ENCOUNTER — Ambulatory Visit: Payer: Medicare HMO | Admitting: Sports Medicine

## 2018-06-03 ENCOUNTER — Encounter: Payer: Self-pay | Admitting: Sports Medicine

## 2018-06-03 VITALS — BP 100/70 | HR 59 | Ht 69.0 in | Wt 139.8 lb

## 2018-06-03 DIAGNOSIS — M1611 Unilateral primary osteoarthritis, right hip: Secondary | ICD-10-CM | POA: Diagnosis not present

## 2018-06-03 DIAGNOSIS — G8929 Other chronic pain: Secondary | ICD-10-CM | POA: Diagnosis not present

## 2018-06-03 DIAGNOSIS — M25551 Pain in right hip: Secondary | ICD-10-CM

## 2018-06-03 DIAGNOSIS — M5136 Other intervertebral disc degeneration, lumbar region: Secondary | ICD-10-CM | POA: Diagnosis not present

## 2018-06-03 NOTE — Progress Notes (Signed)
Jack Huerta. Jack Huerta, [place] at [place] [employer] [place]  [name] - 73 y.o. male MRN [id number]  Date of birth: 1945-11-26  Visit Date: June 03, 2018  PCP: [name], DO   Referred by: [name], DO  SUBJECTIVE:  Chief Complaint  Patient presents with  . f/u R hip pain    Provided with HEP and has responded well to OMT.   . f/u sciatica    Prescribed Gabapentin 100 mg, d/c d/t possible side effects.   . f/u leg cramps    Sx have improved.     HPI: Patient presents for follow-up to being seen last month for right hip and leg pain.  He is continue to get some clicking and popping in the right hip pain localizes to the lateral hip radiating down the lateral aspect of the leg towards the knee all the way down into the foot.  He did try gabapentin 100 mg at night some resolution of his cramping at night but discontinued this after several days due to concerns for potential addiction.  He did report decreasing his salt intake after being off the gabapentin did cause the cramps to return.  He has been performing home therapeutic exercises diligently has noticed that with performing hip flexor stretching exercises he has a harder time with terminal hip flexion on the right and on the left.  He only has minimal pain with this activity.  He does feel better afterwards and feels as though he is able to walk more normally after the exercises.   REVIEW OF SYSTEMS: He continues to have nighttime leg cramps and disturbances as well as lower extremity edema. Otherwise 12 point review of systems performed and is negative  HISTORY:  Prior history reviewed and updated per electronic medical record.  Social History   Occupational History    Employer: RETIRED  Tobacco Use  . Smoking status: Never Smoker  . Smokeless tobacco: Never Used  Substance and Sexual Activity  . Alcohol use: No  . Drug use: No  . Sexual activity: Not on file    Social History   Social History Narrative  . Not on file     DATA OBTAINED & REVIEWED:  Recent Labs    04/29/18 1439  CALCIUM 9.3  AST 30  ALT 27  TSH 1.69   No problems updated. No specialty comments available.  OBJECTIVE:  VS:  HT:5\' 9"  (175.3 cm)   WT:139 lb 12.8 oz (63.4 kg)  BMI:20.64    BP:100/70  HR:(!) 59 bpm  TEMP: ( )  RESP:98 %   PHYSICAL EXAM:  Well-developed, Well-nourished and In no acute distress Pupils are equal., EOM intact without nystagmus. and No scleral icterus. Alert & appropriately interactive. and Not depressed or anxious appearing.  Warm and well perfused   Bilateral lower extremities are overall well aligned. no pain to palpation, negative SLR test, slight dec. flexion, he had a small amount of pain with Corky Sox testing.  Minimal pain with FADIR with well-preserved range of motion.  He continues to have weakness with side-lying hip abduction on the right.    ASSESSMENT  1. Pain in joint of right hip   2. Chronic right hip pain     PLAN:  Pertinent additional documentation may be included in corresponding procedure notes, imaging studies, problem based documentation and patient instructions. No problem-specific Assessment & Plan notes found for this encounter. Some of his symptoms are actually coming  from hip arthritis however this is not entirely explain the pain radiating all the way down his leg.  Ultimately he will continue with a home therapeutic exercises previously discussed.  We discussed the options of anti-inflammatories, corticosteroid injection and dietary changes.  We did discuss that the dietary changes may not provide enough benefit but considering the addition of Tumeric may be beneficial.  He is hesitant to do so however due to his cardiologist having concerns about the interactions with his beta-blocker.  Procedures:  None  No orders of the defined types were placed in this encounter. Lab Orders  No laboratory  test(s) ordered today   Imaging Orders  DG Lumbar Spine 2-3 Views  DG HIP UNILAT W OR W/O PELVIS 2-3 VIEWS RIGHT  Referral Orders  No referral(s) requested today    Further Discussion/Instructions:  Patient Instructions  Look into the book written by Dr. Tonna Boehringer about the anti-inflammatory diet.   Activity modifications and the importance of avoiding exacerbating activities (limiting pain to no more than a 4 / 10 during or following activity) recommended and discussed. Discussed red flag symptoms that warrant earlier emergent evaluation and patient voices understanding. >50% of this 25 minutes minute visit spent in direct patient counseling and/or coordination of care. Discussion was focused on education regarding the in discussing the pathoetiology and anticipated clinical course of the above condition. He should continue with the home therapeutic exercises previously discussed.   At follow up will plan : initial corticosteroid injections Return if symptoms worsen or fail to improve, for as needed for ongoing issues.          [name], [facility] Physician

## 2018-06-05 ENCOUNTER — Ambulatory Visit (INDEPENDENT_AMBULATORY_CARE_PROVIDER_SITE_OTHER): Payer: Medicare HMO | Admitting: Family Medicine

## 2018-06-05 ENCOUNTER — Encounter: Payer: Self-pay | Admitting: Family Medicine

## 2018-06-05 VITALS — BP 100/60 | HR 51 | Temp 97.4°F | Ht 69.0 in | Wt 139.8 lb

## 2018-06-05 DIAGNOSIS — R252 Cramp and spasm: Secondary | ICD-10-CM | POA: Insufficient documentation

## 2018-06-05 DIAGNOSIS — Z23 Encounter for immunization: Secondary | ICD-10-CM

## 2018-06-05 DIAGNOSIS — M25551 Pain in right hip: Secondary | ICD-10-CM | POA: Diagnosis not present

## 2018-06-05 DIAGNOSIS — I2510 Atherosclerotic heart disease of native coronary artery without angina pectoris: Secondary | ICD-10-CM

## 2018-06-05 DIAGNOSIS — Z87898 Personal history of other specified conditions: Secondary | ICD-10-CM

## 2018-06-05 LAB — COMPREHENSIVE METABOLIC PANEL
ALT: 18 U/L (ref 0–53)
AST: 17 U/L (ref 0–37)
Albumin: 3.6 g/dL (ref 3.5–5.2)
Alkaline Phosphatase: 56 U/L (ref 39–117)
BUN: 23 mg/dL (ref 6–23)
CO2: 32 mEq/L (ref 19–32)
Calcium: 9 mg/dL (ref 8.4–10.5)
Chloride: 99 mEq/L (ref 96–112)
Creatinine, Ser: 0.99 mg/dL (ref 0.40–1.50)
GFR: 78.43 mL/min (ref >60.00)
Glucose, Bld: 86 mg/dL (ref 70–99)
Potassium: 4.5 mEq/L (ref 3.5–5.1)
Sodium: 136 mEq/L (ref 135–145)
Total Bilirubin: 0.3 mg/dL (ref 0.2–1.2)
Total Protein: 6 g/dL (ref 6.0–8.3)

## 2018-06-05 LAB — CK: Total CK: 101 U/L (ref 7–232)

## 2018-06-05 NOTE — Patient Instructions (Addendum)
The 10-year ASCVD risk score Mikey Bussing DC Brooke Bonito., et al., 2013) is: 13.1%   Values used to calculate the score:     Age: 73 years     Sex: Male     Is Non-Hispanic African American: No     Diabetic: No     Tobacco smoker: No     Systolic Blood Pressure: 751 mmHg     Is BP treated: No     HDL Cholesterol: 77.9 mg/dL     Total Cholesterol: 182 mg/dL  I would recommend a Cardiac CT based on these numbers. This costs $150 out of pocket. I could also send you to a Cardiologist to discuss.   I put in that referral to PT.

## 2018-06-05 NOTE — Progress Notes (Signed)
Jack Huerta is a 73 y.o. male is here for follow up.  History of Present Illness:   I, Jack Huerta, acting as a Education administrator for Jack Deutscher, DO.,have documented all relevant documentation on the behalf of Jack Deutscher, DO,as directed by  Jack Deutscher, DO while in the presence of Jack Deutscher, DO.  HPI: Pt present to follow up regarding lab results. Pt stated he has " a lot of lab results but doesn't know what it means" Also pt is wanting to know what he can do to help reduce inflammation in his body.   Health Maintenance Due  Topic Date Due  . TETANUS/TDAP  12/01/1962   No flowsheet data found. PMHx, SurgHx, SocialHx, FamHx, Medications, and Allergies were reviewed in the Visit Navigator and updated as appropriate.   Patient Active Problem List   Diagnosis Date Noted  . ASCVD (arteriosclerotic cardiovascular disease) > 10 06/05/2018  . Leg cramps 06/05/2018  . Pain in joint of right hip 07/26/2017  . Palpitations 07/26/2015  . Bradycardia 07/26/2015  . PSVT (paroxysmal supraventricular tachycardia) (Jack Huerta) 07/24/2014  . Special screening for malignant neoplasms, colon 08/07/2011  . Diverticulosis of colon (without mention of hemorrhage) 08/07/2011   Social History   Tobacco Use  . Smoking status: Never Smoker  . Smokeless tobacco: Never Used  Substance Use Topics  . Alcohol use: No  . Drug use: No   Current Medications and Allergies:   .  Ascorbic Acid (VITAMIN C) 1000 MG tablet, Take 1,000 mg by mouth daily., Disp: , Rfl:  .  aspirin 81 MG tablet, Take 81 mg by mouth daily., Disp: , Rfl:  .  atenolol (TENORMIN) 25 MG tablet, TAKE ONE TABLET BY MOUTH ONE TIME DAILY , Disp: 90 tablet, Rfl: 3 .  Biotin 5 MG CAPS, Take 5 mg by mouth daily., Disp: , Rfl:   No Known Allergies   Review of Systems   Pertinent items are noted in the HPI. Otherwise, a complete ROS is negative.  Vitals:   Vitals:   06/05/18 0851  BP: 100/60  Pulse: (!) 51  Temp: (!) 97.4 F  (36.3 C)  TempSrc: Oral  SpO2: 100%  Weight: 139 lb 12.8 oz (63.4 kg)  Height: _0  (1.753 m)     Body mass index is 20.64 kg/m.  Physical Exam:   Physical Exam Vitals signs and nursing note reviewed.  Constitutional:      General: He is not in acute distress.    Appearance: He is well-developed.  HENT:     Head: Normocephalic and atraumatic.     Right Ear: External ear normal.     Left Ear: External ear normal.     Nose: Nose normal.  Eyes:     Conjunctiva/sclera: Conjunctivae normal.     Pupils: Pupils are equal, round, and reactive to light.  Neck:     Musculoskeletal: Neck supple.  Cardiovascular:     Rate and Rhythm: Normal rate and regular rhythm.  Pulmonary:     Effort: Pulmonary effort is normal.  Abdominal:     General: Bowel sounds are normal.     Palpations: Abdomen is soft.  Musculoskeletal: Normal range of motion.  Skin:    General: Skin is warm.  Neurological:     Mental Status: He is alert.  Psychiatric:        Behavior: Behavior normal.    Results for orders placed or performed in visit on 04/29/18  CBC with Differential/Platelet  Result Value  Ref Range   WBC 4.6 4.0 - 10.5 K/uL   RBC 4.48 4.22 - 5.81 Mil/uL   Hemoglobin 14.6 13.0 - 17.0 g/dL   HCT 43.0 39.0 - 52.0 %   MCV 96.0 78.0 - 100.0 fl   MCHC 33.9 30.0 - 36.0 g/dL   RDW 13.1 11.5 - 15.5 %   Platelets 205.0 150.0 - 400.0 K/uL   Neutrophils Relative % 72.9 43.0 - 77.0 %   Lymphocytes Relative 14.0 12.0 - 46.0 %   Monocytes Relative 11.5 3.0 - 12.0 %   Eosinophils Relative 0.5 0.0 - 5.0 %   Basophils Relative 1.1 0.0 - 3.0 %   Neutro Abs 3.4 1.4 - 7.7 K/uL   Lymphs Abs 0.6 (L) 0.7 - 4.0 K/uL   Monocytes Absolute 0.5 0.1 - 1.0 K/uL   Eosinophils Absolute 0.0 0.0 - 0.7 K/uL   Basophils Absolute 0.0 0.0 - 0.1 K/uL  Comprehensive metabolic panel  Result Value Ref Range   Sodium 134 (L) 135 - 145 mEq/L   Potassium 4.7 3.5 - 5.1 mEq/L   Chloride 95 (L) 96 - 112 mEq/L   CO2 32 19 -  32 mEq/L   Glucose, Bld 88 70 - 99 mg/dL   BUN 20 6 - 23 mg/dL   Creatinine, Ser 0.82 0.40 - 1.50 mg/dL   Total Bilirubin 0.5 0.2 - 1.2 mg/dL   Alkaline Phosphatase 58 39 - 117 U/L   AST 30 0 - 37 U/L   ALT 27 0 - 53 U/L   Total Protein 6.8 6.0 - 8.3 g/dL   Albumin 4.2 3.5 - 5.2 g/dL   Calcium 9.3 8.4 - 10.5 mg/dL   GFR 97.50 >60.00 mL/min  TSH  Result Value Ref Range   TSH 1.69 0.35 - 4.50 uIU/mL  T4, free  Result Value Ref Range   Free T4 0.79 0.60 - 1.60 ng/dL  Lipid panel  Result Value Ref Range   Cholesterol 182 0 - 200 mg/dL   Triglycerides 41.0 0.0 - 149.0 mg/dL   HDL 77.90 >39.00 mg/dL   VLDL 8.2 0.0 - 40.0 mg/dL   LDL Cholesterol 96 0 - 99 mg/dL   Total CHOL/HDL Ratio 2    NonHDL 104.45   Hepatitis C antibody  Result Value Ref Range   Hepatitis C Ab NON-REACTIVE NON-REACTI   SIGNAL TO CUT-OFF 0.09 <1.00  T3, free  Result Value Ref Range   T3, Free 2.5 2.3 - 4.2 pg/mL  CK  Result Value Ref Range   Total CK 355 (H) 7 - 232 U/L  CRP High sensitivity  Result Value Ref Range   CRP, High Sensitivity 3.400 0.000 - 5.000 mg/L  Sedimentation rate  Result Value Ref Range   Sed Rate 8 0 - 20 mm/hr   Assessment and Plan:   Jack Huerta was seen today for consult.  Diagnoses and all orders for this visit:  Leg cramps Comments: Resolved.  Orders: -     Comp Met (CMET) -     CK  Need for pneumococcal vaccination -     Pneumococcal polysaccharide vaccine 23-valent greater than or equal to 2yo subcutaneous/IM  Pain in joint of right hip -     Ambulatory referral to Physical Therapy  ASCVD (arteriosclerotic cardiovascular disease) > 10 Comments: Discussed interventions. Offered Cardiology follow up to reassure patient.     . Orders and follow up as documented in [place], reviewed diet, exercise and weight control, cardiovascular risk and specific lipid/LDL goals reviewed,  reviewed medications and side effects in detail.  . Reviewed expectations re: course of  current medical issues. . Outlined signs and symptoms indicating need for more acute intervention. . Patient verbalized understanding and all questions were answered. . Patient received an After Visit Summary.  CMA served as Education administrator during this visit. History, Physical, and Plan performed by medical provider. The above documentation has been reviewed and is accurate and complete. Jack Huerta, D.O.  Jack Deutscher, DO [place] 06/06/2018

## 2018-06-28 ENCOUNTER — Encounter: Payer: Self-pay | Admitting: Sports Medicine

## 2018-07-03 ENCOUNTER — Ambulatory Visit: Payer: Medicare HMO | Admitting: Physical Therapy

## 2018-07-03 ENCOUNTER — Encounter: Payer: Self-pay | Admitting: Physical Therapy

## 2018-07-03 DIAGNOSIS — M25551 Pain in right hip: Secondary | ICD-10-CM | POA: Diagnosis not present

## 2018-07-03 NOTE — Patient Instructions (Signed)
Access Code: [email or web address]  URL: [email or web address]  Date: 07/03/2018  Prepared by: Lyndee Hensen   Exercises  Single Knee to Chest Stretch - 3 reps - 30 hold - 2x daily  Supine Piriformis Stretch - 3 reps - 30 hold - 2x daily  Seated Piriformis Stretch with Trunk Bend - 3 reps - 30 hold - 2x daily  Supine ITB Stretch with Strap - 3 reps - 30 hold - 2x daily

## 2018-07-06 ENCOUNTER — Encounter: Payer: Self-pay | Admitting: Physical Therapy

## 2018-07-06 NOTE — Therapy (Signed)
[facility] [address] [place], [place], [address] Phone: [phone]   Fax:  [phone]  Physical Therapy Evaluation  Patient Details  Name: Jack Huerta MRN: [id number] Date of Birth: Oct 13, 1945 Referring Provider (PT): Marita Snellen   Encounter Date: 07/03/2018  PT End of Session - 07/06/18 1314    Visit Number  1    Number of Visits  12    Date for PT Re-Evaluation  08/14/18    Authorization Type  [employer]na Medicare    PT Start Time  [phone]    PT Stop Time  0930    PT Time Calculation (min)  40 min    Activity Tolerance  Patient tolerated treatment well    Behavior During Therapy  [facility] for tasks assessed/performed       Past Medical History:  Diagnosis Date  . Chicken pox   . False positive serological test for hepatitis C   . [place] measles   . Hemorrhoids   . Mumps   . SVT (supraventricular tachycardia) ([place]), exercise induced 1993  . Tinnitus     Past Surgical History:  Procedure Laterality Date  . APPENDECTOMY  1958  . INGUINAL HERNIA REPAIR Left 04/2010  . INGUINAL HERNIA REPAIR Right 04/2001  . INGUINAL HERNIA REPAIR Left 1973  . TONSILLECTOMY  1962    There were no vitals filed for this visit.   Subjective Assessment - 07/06/18 1313    Subjective  Pt states increased pain in R hip, since 2016. He has most pain in R lateral hip, that radiates down to lateral lower leg and toes. He has had chronic back pain,but has not had hip pain until 2016. He is retired, but is active, goes to [employer] 3-4 days/wk.     Limitations  Standing;Walking;House hold activities    Currently in Pain?  Yes    Pain Score  4     Pain Location  Hip    Pain Orientation  Right    Pain Descriptors / Indicators  Burning;Radiating    Pain Type  Acute pain    Pain Onset  More than a month ago    Pain Frequency  Intermittent    Aggravating Factors   lateral motion/ elliptial, standing    Pain Relieving Factors  massage         [facility]  PT Assessment - 07/06/18 0001      Assessment   Medical Diagnosis  R hip pain    Referring Provider (PT)  Marita Snellen    Onset Date/Surgical Date  02/20/15    Prior Therapy  No      Balance Screen   Has the patient fallen in the past 6 months  No      Prior Function   Level of Independence  Independent      Cognition   Overall Cognitive Status  Within Functional Limits for tasks assessed      Posture/Postural Control   Posture Comments  R LE shorter than L in supine;       AROM   Overall AROM Comments  Lumbar: ext, SB: mild limtation, Flexion: WFL,   Hips: Mild limitation on R for rotation (pain)       Strength   Overall Strength Comments  Hips: 4/5, Glute: 4-/5;        Palpation   Palpation comment  Pain in R glute, glute med, pirirformis, Pain in R greater troch, and ITB      Special Tests  Other special tests  Increased pain with Corky Sox, increased pain with hip IR/ER,                 Objective measurements completed on examination: See above findings.      [facility] Adult PT Treatment/Exercise - 07/06/18 0001      Exercises   Exercises  Knee/Hip      Knee/Hip Exercises: Stretches   ITB Stretch  2 reps;30 seconds    ITB Stretch Limitations  supine with strap     Piriformis Stretch  2 reps;30 seconds    Piriformis Stretch Limitations  seated and supine    Other Knee/Hip Stretches  SKTC 30 sec x3;               PT Education - 07/06/18 1314    Education Details  PT POC, HEP    Person(s) Educated  Patient    Methods  Explanation;Demonstration;Tactile cues;Verbal cues;Handout    Comprehension  Verbalized understanding;Returned demonstration;Need further instruction;Verbal cues required       PT Short Term Goals - 07/06/18 1324      PT SHORT TERM GOAL #1   Title  Pt to be independent with initial HEP     Time  2    Period  Weeks    Status  New    Target Date  07/17/18      PT SHORT TERM GOAL #2   Title  Pt to report decreased pain in R hip  to 3/10 with activity     Time  2    Period  Weeks    Status  New    Target Date  07/17/18        PT Long Term Goals - 07/06/18 1327      PT LONG TERM GOAL #1   Title  Pt to be independent with final HEP     Time  6    Period  Weeks    Status  New    Target Date  08/14/18      PT LONG TERM GOAL #2   Title  Pt to report decreased pain to 0-2/10 with activity in R hip .     Time  6    Period  Weeks    Status  New    Target Date  08/14/18      PT LONG TERM GOAL #3   Title  Pt to demo increased strength of R hip to be at least 4+/5 to improve stability and ambulation.     Time  6    Period  Weeks    Status  New    Target Date  08/14/18      PT LONG TERM GOAL #4   Title  Pt to demo ability for ambulation, up to 1 mi, without pain greater than 2/10 in R hip, to improve ability for community navigation.     Time  6    Period  Weeks    Status  New    Target Date  08/14/18             Plan - 07/06/18 1331    Clinical Impression Statement  Pt presents with primary complaint of increased pain in R hip. He has mild decrease in ROM , and decreased strength. He has increased pain in lateral and posterior hip, that radiated down lateral thigh. Pt with decreased ability for standing, walking, IADLS, and and functional activities, due to pain. Pt to benefit from skilled PT  to improve pain and return to PLOF.     Clinical Presentation  Stable    Clinical Decision Making  Low    Rehab Potential  Good    PT Frequency  2x / week    PT Duration  6 weeks    PT Treatment/Interventions  ADLs/Self Care Home Management;Cryotherapy;Electrical Stimulation;Iontophoresis 4mg /ml Dexamethasone;Functional mobility training;Stair training;Gait training;Ultrasound;Traction;Moist Heat;Therapeutic activities;Therapeutic exercise;Balance training;Neuromuscular re-education;Patient/family education;Passive range of motion;Manual techniques;Dry needling;Taping;Spinal Manipulations;Joint Manipulations     Consulted and Agree with Plan of Care  Patient       Patient will benefit from skilled therapeutic intervention in order to improve the following deficits and impairments:  Abnormal gait, Decreased range of motion, Difficulty walking, Increased muscle spasms, Decreased endurance, Decreased activity tolerance, Pain, Hypomobility, Decreased balance, Impaired flexibility, Improper body mechanics, Decreased strength, Decreased mobility  Visit Diagnosis: Pain in right hip     Problem List Patient Active Problem List   Diagnosis Date Noted  . ASCVD (arteriosclerotic cardiovascular disease) > 10 06/05/2018  . Leg cramps 06/05/2018  . Pain in joint of right hip 07/26/2017  . Palpitations 07/26/2015  . Bradycardia 07/26/2015  . PSVT (paroxysmal supraventricular tachycardia) ([place]) 07/24/2014  . Special screening for malignant neoplasms, colon 08/07/2011  . Diverticulosis of colon (without mention of hemorrhage) 08/07/2011     Lyndee Hensen, PT, DPT 1:33 PM  07/06/18   [place], [place], [address] Phone: [phone]   Fax:  [phone]  Name: Jack Huerta MRN: [id number] Date of Birth: Jan 07, 1946

## 2018-07-18 ENCOUNTER — Ambulatory Visit: Payer: Medicare HMO | Admitting: Physical Therapy

## 2018-07-18 ENCOUNTER — Encounter: Payer: Self-pay | Admitting: Physical Therapy

## 2018-07-18 DIAGNOSIS — M25551 Pain in right hip: Secondary | ICD-10-CM | POA: Diagnosis not present

## 2018-07-18 NOTE — Therapy (Signed)
[facility] [address] [place], [place], [address] Phone: [phone]   Fax:  [phone]  Physical Therapy Treatment  Patient Details  Name: Jack Huerta MRN: [id number] Date of Birth: 11-30-1945 Referring Provider (PT): Marita Snellen   Encounter Date: 07/18/2018  PT End of Session - 07/18/18 0858    Visit Number  2    Number of Visits  12    Date for PT Re-Evaluation  08/14/18    Authorization Type  A[employer]na Medicare    PT Start Time  0848    PT Stop Time  0932    PT Time Calculation (min)  44 min    Activity Tolerance  Patient tolerated treatment well    Behavior During Therapy  [facility] for tasks assessed/performed       Past Medical History:  Diagnosis Date  . Chicken pox   . False positive serological test for hepatitis C   . [place] measles   . Hemorrhoids   . Mumps   . SVT (supraventricular tachycardia) ([place]), exercise induced 1993  . Tinnitus     Past Surgical History:  Procedure Laterality Date  . APPENDECTOMY  1958  . INGUINAL HERNIA REPAIR Left 04/2010  . INGUINAL HERNIA REPAIR Right 04/2001  . INGUINAL HERNIA REPAIR Left 1973  . TONSILLECTOMY  1962    There were no vitals filed for this visit.  Subjective Assessment - 07/18/18 0858    Subjective  Pt states soreness with HEP, but decreased pain after.     Currently in Pain?  Yes    Pain Score  3     Pain Orientation  Right    Pain Descriptors / Indicators  Burning;Radiating    Pain Type  Acute pain    Pain Onset  More than a month ago    Pain Frequency  Intermittent                       [facility] Adult PT Treatment/Exercise - 07/18/18 0857      Posture/Postural Control   Posture Comments  --      Exercises   Exercises  Knee/Hip      Knee/Hip Exercises: Stretches   ITB Stretch  2 reps;30 seconds    ITB Stretch Limitations  supine with strap and ankle pump    Piriformis Stretch  2 reps;30 seconds    Piriformis Stretch Limitations   supine    Other Knee/Hip Stretches  SKTC 30 sec x3;        Knee/Hip Exercises: Aerobic   Stationary Bike  L1 x 7 min      Knee/Hip Exercises: Supine   Other Supine Knee/Hip Exercises  pelvic tilts x20;       Manual Therapy   Manual Therapy  Soft tissue mobilization;Passive ROM;Joint mobilization    Joint Mobilization  Long leg distraction on R 10 sec x 6;     Soft tissue mobilization  DTM/IASTM to glute, pirifirmis and hip rotators ;     Passive ROM  For R hip, manual ITB stretching.                PT Short Term Goals - 07/06/18 1324      PT SHORT TERM GOAL #1   Title  Pt to be independent with initial HEP     Time  2    Period  Weeks    Status  New    Target Date  07/17/18  PT SHORT TERM GOAL #2   Title  Pt to report decreased pain in R hip to 3/10 with activity     Time  2    Period  Weeks    Status  New    Target Date  07/17/18        PT Long Term Goals - 07/06/18 1327      PT LONG TERM GOAL #1   Title  Pt to be independent with final HEP     Time  6    Period  Weeks    Status  New    Target Date  08/14/18      PT LONG TERM GOAL #2   Title  Pt to report decreased pain to 0-2/10 with activity in R hip .     Time  6    Period  Weeks    Status  New    Target Date  08/14/18      PT LONG TERM GOAL #3   Title  Pt to demo increased strength of R hip to be at least 4+/5 to improve stability and ambulation.     Time  6    Period  Weeks    Status  New    Target Date  08/14/18      PT LONG TERM GOAL #4   Title  Pt to demo ability for ambulation, up to 1 mi, without pain greater than 2/10 in R hip, to improve ability for community navigation.     Time  6    Period  Weeks    Status  New    Target Date  08/14/18            Plan - 07/18/18 1127    Clinical Impression Statement  Pt with soreness at lateral and posterior hip, in glute and piriformis with DTM today. Dry needling may be helpful in future. Pt with several questions regarding his  pain today, discussed lumbar radiculopathy at length with him today. Pt requires max cuing for performing ther ex correctly, plan to progress as tolerated.     Rehab Potential  Good    PT Frequency  2x / week    PT Duration  6 weeks    PT Treatment/Interventions  ADLs/Self Care Home Management;Cryotherapy;Electrical Stimulation;Iontophoresis 4mg /ml Dexamethasone;Functional mobility training;Stair training;Gait training;Ultrasound;Traction;Moist Heat;Therapeutic activities;Therapeutic exercise;Balance training;Neuromuscular re-education;Patient/family education;Passive range of motion;Manual techniques;Dry needling;Taping;Spinal Manipulations;Joint Manipulations    Consulted and Agree with Plan of Care  Patient       Patient will benefit from skilled therapeutic intervention in order to improve the following deficits and impairments:  Abnormal gait, Decreased range of motion, Difficulty walking, Increased muscle spasms, Decreased endurance, Decreased activity tolerance, Pain, Hypomobility, Decreased balance, Impaired flexibility, Improper body mechanics, Decreased strength, Decreased mobility  Visit Diagnosis: Pain in right hip     Problem List Patient Active Problem List   Diagnosis Date Noted  . ASCVD (arteriosclerotic cardiovascular disease) > 10 06/05/2018  . Leg cramps 06/05/2018  . Pain in joint of right hip 07/26/2017  . Palpitations 07/26/2015  . Bradycardia 07/26/2015  . PSVT (paroxysmal supraventricular tachycardia) ([place]) 07/24/2014  . Special screening for malignant neoplasms, colon 08/07/2011  . Diverticulosis of colon (without mention of hemorrhage) 08/07/2011    Lyndee Hensen, PT, DPT 11:33 AM  07/18/18    [facility] [place], [place], [address] Phone: [phone]   Fax:  [phone]  Name: KAION TISDALE MRN: [id number] Date of  Birth: Sep 21, 1945

## 2018-07-23 ENCOUNTER — Encounter: Payer: Self-pay | Admitting: Physical Therapy

## 2018-07-23 ENCOUNTER — Ambulatory Visit: Payer: Medicare HMO | Admitting: Physical Therapy

## 2018-07-23 DIAGNOSIS — M25551 Pain in right hip: Secondary | ICD-10-CM

## 2018-07-23 NOTE — Therapy (Signed)
[facility] [address] [place], [place], [address] Phone: [phone]   Fax:  [phone]  Physical Therapy Treatment  Patient Details  Name: Jack Huerta MRN: [id number] Date of Birth: 12/12/1945 Referring Provider (PT): Marita Snellen   Encounter Date: 07/23/2018  PT End of Session - 07/23/18 1602    Visit Number  3    Number of Visits  12    Date for PT Re-Evaluation  08/14/18    Authorization Type  [employer]na Medicare    PT Start Time  1430    PT Stop Time  1513    PT Time Calculation (min)  43 min    Activity Tolerance  Patient tolerated treatment well    Behavior During Therapy  [facility] for tasks assessed/performed       Past Medical History:  Diagnosis Date  . Chicken pox   . False positive serological test for hepatitis C   . [place] measles   . Hemorrhoids   . Mumps   . SVT (supraventricular tachycardia) ([place]), exercise induced 1993  . Tinnitus     Past Surgical History:  Procedure Laterality Date  . APPENDECTOMY  1958  . INGUINAL HERNIA REPAIR Left 04/2010  . INGUINAL HERNIA REPAIR Right 04/2001  . INGUINAL HERNIA REPAIR Left 1973  . TONSILLECTOMY  1962    There were no vitals filed for this visit.  Subjective Assessment - 07/23/18 1602    Subjective  Pt states "less burning" in LE when doing his HEP.     Currently in Pain?  Yes    Pain Score  3     Pain Location  Hip    Pain Orientation  Right    Pain Descriptors / Indicators  Burning;Radiating    Pain Type  Acute pain    Pain Onset  More than a month ago    Pain Frequency  Intermittent                       [facility] Adult PT Treatment/Exercise - 07/23/18 1433      Exercises   Exercises  Knee/Hip      Knee/Hip Exercises: Stretches   Active Hamstring Stretch  3 reps;30 seconds    Active Hamstring Stretch Limitations  seated    ITB Stretch  2 reps;30 seconds    ITB Stretch Limitations  supine with strap and ankle pump    Piriformis  Stretch  --    Piriformis Stretch Limitations  --    Other Knee/Hip Stretches  SKTC 30 sec x3;  LTR x15;     Other Knee/Hip Stretches  Standing R QL stretch 30 sec x3 at wall      Knee/Hip Exercises: Aerobic   Stationary Bike  L2 x 7 min      Knee/Hip Exercises: Supine   Other Supine Knee/Hip Exercises  --      Manual Therapy   Manual Therapy  Soft tissue mobilization;Passive ROM;Joint mobilization    Joint Mobilization  Long leg distraction on R 10 sec x 6; Post hip mobs;     Soft tissue mobilization  DTM/IASTM to glute, pirifirmis and hip rotators ;     Passive ROM  For R hip,               PT Short Term Goals - 07/06/18 1324      PT SHORT TERM GOAL #1   Title  Pt to be independent with initial HEP  Time  2    Period  Weeks    Status  New    Target Date  07/17/18      PT SHORT TERM GOAL #2   Title  Pt to report decreased pain in R hip to 3/10 with activity     Time  2    Period  Weeks    Status  New    Target Date  07/17/18        PT Long Term Goals - 07/06/18 1327      PT LONG TERM GOAL #1   Title  Pt to be independent with final HEP     Time  6    Period  Weeks    Status  New    Target Date  08/14/18      PT LONG TERM GOAL #2   Title  Pt to report decreased pain to 0-2/10 with activity in R hip .     Time  6    Period  Weeks    Status  New    Target Date  08/14/18      PT LONG TERM GOAL #3   Title  Pt to demo increased strength of R hip to be at least 4+/5 to improve stability and ambulation.     Time  6    Period  Weeks    Status  New    Target Date  08/14/18      PT LONG TERM GOAL #4   Title  Pt to demo ability for ambulation, up to 1 mi, without pain greater than 2/10 in R hip, to improve ability for community navigation.     Time  6    Period  Weeks    Status  New    Target Date  08/14/18            Plan - 07/23/18 1603    Clinical Impression Statement  Pt with some decrease in soreness and trigger point in glute region,  with manual (from previous session). Pt to benefit from DN next visit, will add strength for hips and core as able.     Rehab Potential  Good    PT Frequency  2x / week    PT Duration  6 weeks    PT Treatment/Interventions  ADLs/Self Care Home Management;Cryotherapy;Electrical Stimulation;Iontophoresis 4mg /ml Dexamethasone;Functional mobility training;Stair training;Gait training;Ultrasound;Traction;Moist Heat;Therapeutic activities;Therapeutic exercise;Balance training;Neuromuscular re-education;Patient/family education;Passive range of motion;Manual techniques;Dry needling;Taping;Spinal Manipulations;Joint Manipulations    Consulted and Agree with Plan of Care  Patient       Patient will benefit from skilled therapeutic intervention in order to improve the following deficits and impairments:  Abnormal gait, Decreased range of motion, Difficulty walking, Increased muscle spasms, Decreased endurance, Decreased activity tolerance, Pain, Hypomobility, Decreased balance, Impaired flexibility, Improper body mechanics, Decreased strength, Decreased mobility  Visit Diagnosis: Pain in right hip     Problem List Patient Active Problem List   Diagnosis Date Noted  . ASCVD (arteriosclerotic cardiovascular disease) > 10 06/05/2018  . Leg cramps 06/05/2018  . Pain in joint of right hip 07/26/2017  . Palpitations 07/26/2015  . Bradycardia 07/26/2015  . PSVT (paroxysmal supraventricular tachycardia) ([place]) 07/24/2014  . Special screening for malignant neoplasms, colon 08/07/2011  . Diverticulosis of colon (without mention of hemorrhage) 08/07/2011    Lyndee Hensen, PT, DPT 4:04 PM  07/23/18    [facility] [place], [place], [address] Phone: [phone]   Fax:  [phone]  Name: ELIUD POLO MRN: [id number] Date of Birth: 26-Jun-1945

## 2018-07-25 ENCOUNTER — Ambulatory Visit: Payer: Medicare HMO | Admitting: Physical Therapy

## 2018-07-25 ENCOUNTER — Encounter: Payer: Self-pay | Admitting: Physical Therapy

## 2018-07-25 DIAGNOSIS — M25551 Pain in right hip: Secondary | ICD-10-CM

## 2018-07-25 NOTE — Therapy (Signed)
[facility] [address] [place], [place], [address] Phone: [phone]   Fax:  [phone]  Physical Therapy Treatment  Patient Details  Name: Jack Huerta MRN: [id number] Date of Birth: 04-Sep-1945 Referring Provider (PT): Marita Snellen   Encounter Date: 07/25/2018  PT End of Session - 07/25/18 0908    Visit Number  4    Number of Visits  12    Date for PT Re-Evaluation  08/14/18    Authorization Type  Aetna Medicare    PT Start Time  0840    PT Stop Time  0930    PT Time Calculation (min)  50 min    Activity Tolerance  Patient tolerated treatment well    Behavior During Therapy  [facility] for tasks assessed/performed       Past Medical History:  Diagnosis Date  . Chicken pox   . False positive serological test for hepatitis C   . [place] measles   . Hemorrhoids   . Mumps   . SVT (supraventricular tachycardia) ([place]), exercise induced 1993  . Tinnitus     Past Surgical History:  Procedure Laterality Date  . APPENDECTOMY  1958  . INGUINAL HERNIA REPAIR Left 04/2010  . INGUINAL HERNIA REPAIR Right 04/2001  . INGUINAL HERNIA REPAIR Left 1973  . TONSILLECTOMY  1962    There were no vitals filed for this visit.  Subjective Assessment - 07/25/18 0907    Subjective  Pt states less burning in LE.     Currently in Pain?  Yes    Pain Score  3     Pain Location  Hip    Pain Orientation  Right    Pain Descriptors / Indicators  Burning;Radiating    Pain Type  Acute pain    Pain Onset  More than a month ago    Pain Frequency  Intermittent                       [facility] Adult PT Treatment/Exercise - 07/25/18 0908      Exercises   Exercises  Knee/Hip      Knee/Hip Exercises: Stretches   Active Hamstring Stretch  3 reps;30 seconds    Active Hamstring Stretch Limitations  seated    ITB Stretch  2 reps;30 seconds    ITB Stretch Limitations  supine with strap and ankle pump    Other Knee/Hip Stretches  SKTC 30  sec x3;  LTR x15;     Other Knee/Hip Stretches  Standing R QL stretch 30 sec x3 at wall      Knee/Hip Exercises: Aerobic   Stationary Bike  L2 x 7 min      Knee/Hip Exercises: Standing   Hip Abduction  2 sets;10 reps;Both    Hip Extension  10 reps;Both    Other Standing Knee Exercises  Side glides Bil x10;  Side lunge stretch x5 bil 5 sec/ at counter;  Standing QL stretch 30 sec x2 on R;       Knee/Hip Exercises: Supine   Bridges  20 reps    Other Supine Knee/Hip Exercises  Clam GTB x20 ;       Manual Therapy   Manual Therapy  Soft tissue mobilization;Passive ROM;Joint mobilization    Manual therapy comments  skilled palpation and monitoring of soft tissue during dry needling.     Joint Mobilization  --    Soft tissue mobilization  DTM/IASTM to glute, pirifirmis and hip rotators ;  Passive ROM  For R hip,       Trigger Point Dry Needling - 07/25/18 0001    Consent Given?  Yes    Education Handout Provided  Yes    Muscles Treated Back/Hip  Piriformis;Gluteus medius    Gluteus Medius Response  Palpable increased muscle length    Piriformis Response  Palpable increased muscle length             PT Short Term Goals - 07/06/18 1324      PT SHORT TERM GOAL #1   Title  Pt to be independent with initial HEP     Time  2    Period  Weeks    Status  New    Target Date  07/17/18      PT SHORT TERM GOAL #2   Title  Pt to report decreased pain in R hip to 3/10 with activity     Time  2    Period  Weeks    Status  New    Target Date  07/17/18        PT Long Term Goals - 07/06/18 1327      PT LONG TERM GOAL #1   Title  Pt to be independent with final HEP     Time  6    Period  Weeks    Status  New    Target Date  08/14/18      PT LONG TERM GOAL #2   Title  Pt to report decreased pain to 0-2/10 with activity in R hip .     Time  6    Period  Weeks    Status  New    Target Date  08/14/18      PT LONG TERM GOAL #3   Title  Pt to demo increased strength of R  hip to be at least 4+/5 to improve stability and ambulation.     Time  6    Period  Weeks    Status  New    Target Date  08/14/18      PT LONG TERM GOAL #4   Title  Pt to demo ability for ambulation, up to 1 mi, without pain greater than 2/10 in R hip, to improve ability for community navigation.     Time  6    Period  Weeks    Status  New    Target Date  08/14/18            Plan - 07/25/18 0947    Clinical Impression Statement  Pt with elicited pain during R QL and lateral hip stretch at wall. States increased pain in lateral/anterior hip with walking, still having limp with several first initial steps after getting up. Pt with poor glute activation, education and practice done for this today with ther ex. DN done for hip/glute region, decreased trigger points in this region from previous sessions. Pt to benefit from continued care.     Rehab Potential  Good    PT Frequency  2x / week    PT Duration  6 weeks    PT Treatment/Interventions  ADLs/Self Care Home Management;Cryotherapy;Electrical Stimulation;Iontophoresis 4mg /ml Dexamethasone;Functional mobility training;Stair training;Gait training;Ultrasound;Traction;Moist Heat;Therapeutic activities;Therapeutic exercise;Balance training;Neuromuscular re-education;Patient/family education;Passive range of motion;Manual techniques;Dry needling;Taping;Spinal Manipulations;Joint Manipulations    Consulted and Agree with Plan of Care  Patient       Patient will benefit from skilled therapeutic intervention in order to improve the following deficits and impairments:  Abnormal gait,  Decreased range of motion, Difficulty walking, Increased muscle spasms, Decreased endurance, Decreased activity tolerance, Pain, Hypomobility, Decreased balance, Impaired flexibility, Improper body mechanics, Decreased strength, Decreased mobility  Visit Diagnosis: Pain in right hip     Problem List Patient Active Problem List   Diagnosis Date Noted  .  ASCVD (arteriosclerotic cardiovascular disease) > 10 06/05/2018  . Leg cramps 06/05/2018  . Pain in joint of right hip 07/26/2017  . Palpitations 07/26/2015  . Bradycardia 07/26/2015  . PSVT (paroxysmal supraventricular tachycardia) ([place]) 07/24/2014  . Special screening for malignant neoplasms, colon 08/07/2011  . Diverticulosis of colon (without mention of hemorrhage) 08/07/2011    Lyndee Hensen, PT, DPT 9:49 AM  07/25/18    [facility] [place], [place], [address] Phone: [phone]   Fax:  [phone]  Name: Jack Huerta MRN: [id number] Date of Birth: June 06, 1945

## 2018-07-30 ENCOUNTER — Ambulatory Visit: Payer: Medicare HMO | Admitting: Physical Therapy

## 2018-07-30 DIAGNOSIS — M25551 Pain in right hip: Secondary | ICD-10-CM | POA: Diagnosis not present

## 2018-08-01 ENCOUNTER — Ambulatory Visit (INDEPENDENT_AMBULATORY_CARE_PROVIDER_SITE_OTHER): Payer: Medicare HMO | Admitting: Physical Therapy

## 2018-08-01 ENCOUNTER — Other Ambulatory Visit: Payer: Self-pay

## 2018-08-01 DIAGNOSIS — M25551 Pain in right hip: Secondary | ICD-10-CM | POA: Diagnosis not present

## 2018-08-05 ENCOUNTER — Encounter: Payer: Self-pay | Admitting: Physical Therapy

## 2018-08-05 NOTE — Therapy (Signed)
[facility] [address] [place], [place], [address] Phone: [phone]   Fax:  [phone]  Physical Therapy Treatment  Patient Details  Name: Jack Huerta MRN: [id number] Date of Birth: [birth year] Referring Provider (PT): Marita Snellen   Encounter Date: 08/01/2018  PT End of Session - 08/05/18 1116    Visit Number  6    Number of Visits  12    Date for PT Re-Evaluation  08/14/18    Authorization Type  Aetna Medicare    PT Start Time  0840    PT Stop Time  0925    PT Time Calculation (min)  45 min    Activity Tolerance  Patient tolerated treatment well    Behavior During Therapy  [facility] for tasks assessed/performed       Past Medical History:  Diagnosis Date  . Chicken pox   . False positive serological test for hepatitis C   . [place] measles   . Hemorrhoids   . Mumps   . SVT (supraventricular tachycardia) ([place]), exercise induced 1993  . Tinnitus     Past Surgical History:  Procedure Laterality Date  . APPENDECTOMY  1958  . INGUINAL HERNIA REPAIR Left 04/2010  . INGUINAL HERNIA REPAIR Right 04/2001  . INGUINAL HERNIA REPAIR Left 1973  . TONSILLECTOMY  1962    There were no vitals filed for this visit.  Subjective Assessment - 08/05/18 1115    Subjective  Pt states mild pain overall, but does have bothersome pain in hip and lower leg with increased activity     Currently in Pain?  Yes    Pain Score  3     Pain Location  Hip    Pain Orientation  Right    Pain Descriptors / Indicators  Aching    Pain Type  Acute pain    Pain Onset  More than a month ago    Pain Frequency  Intermittent                       [facility] Adult PT Treatment/Exercise - 08/05/18 1110      Exercises   Exercises  Knee/Hip      Knee/Hip Exercises: Stretches   Active Hamstring Stretch  3 reps;30 seconds    Active Hamstring Stretch Limitations  seated    Other Knee/Hip Stretches  SKTC 30 sec x3;  LTR x15;     Other  Knee/Hip Stretches  Side glides at wall to L x20;       Knee/Hip Exercises: Aerobic   Stationary Bike  L2 x 8 min      Knee/Hip Exercises: Standing   Hip Flexion  20 reps;Knee bent    Hip Abduction  2 sets;10 reps;Both      Knee/Hip Exercises: Seated   Sit to Sand  10 reps      Knee/Hip Exercises: Supine   Bridges  20 reps    Other Supine Knee/Hip Exercises  Clam GTB x20 ;       Manual Therapy   Manual Therapy  Soft tissue mobilization;Passive ROM;Joint mobilization    Joint Mobilization  Long leg distraction on R (hip pump) ; Lumbar s/l mobs on R;  Hip PA mobs (mod fig 4 ) in prone;     Passive ROM  For R hip, with over pressure for flexion;                PT Short Term Goals - 08/05/18 1105  PT SHORT TERM GOAL #1   Title  Pt to be independent with initial HEP     Time  2    Period  Weeks    Status  Achieved    Target Date  07/17/18      PT SHORT TERM GOAL #2   Title  Pt to report decreased pain in R hip to 3/10 with activity     Time  2    Period  Weeks    Status  Achieved    Target Date  07/17/18        PT Long Term Goals - 07/06/18 1327      PT LONG TERM GOAL #1   Title  Pt to be independent with final HEP     Time  6    Period  Weeks    Status  New    Target Date  08/14/18      PT LONG TERM GOAL #2   Title  Pt to report decreased pain to 0-2/10 with activity in R hip .     Time  6    Period  Weeks    Status  New    Target Date  08/14/18      PT LONG TERM GOAL #3   Title  Pt to demo increased strength of R hip to be at least 4+/5 to improve stability and ambulation.     Time  6    Period  Weeks    Status  New    Target Date  08/14/18      PT LONG TERM GOAL #4   Title  Pt to demo ability for ambulation, up to 1 mi, without pain greater than 2/10 in R hip, to improve ability for community navigation.     Time  6    Period  Weeks    Status  New    Target Date  08/14/18            Plan - 08/05/18 1117    Clinical Impression  Statement  Pt with good ability for ther ex and strengthening. He has limitations and soreness with hip flex/abd/er motions, mobilizations done to improve, as well as for pain. Pt educated on hip distraction for HEP.     Rehab Potential  Good    PT Frequency  2x / week    PT Duration  6 weeks    PT Treatment/Interventions  ADLs/Self Care Home Management;Cryotherapy;Electrical Stimulation;Iontophoresis 4mg /ml Dexamethasone;Functional mobility training;Stair training;Gait training;Ultrasound;Traction;Moist Heat;Therapeutic activities;Therapeutic exercise;Balance training;Neuromuscular re-education;Patient/family education;Passive range of motion;Manual techniques;Dry needling;Taping;Spinal Manipulations;Joint Manipulations    Consulted and Agree with Plan of Care  Patient       Patient will benefit from skilled therapeutic intervention in order to improve the following deficits and impairments:  Abnormal gait, Decreased range of motion, Difficulty walking, Increased muscle spasms, Decreased endurance, Decreased activity tolerance, Pain, Hypomobility, Decreased balance, Impaired flexibility, Improper body mechanics, Decreased strength, Decreased mobility  Visit Diagnosis: Pain in right hip     Problem List Patient Active Problem List   Diagnosis Date Noted  . ASCVD (arteriosclerotic cardiovascular disease) > 10 06/05/2018  . Leg cramps 06/05/2018  . Pain in joint of right hip 07/26/2017  . Palpitations 07/26/2015  . Bradycardia 07/26/2015  . PSVT (paroxysmal supraventricular tachycardia) ([place]) 07/24/2014  . Special screening for malignant neoplasms, colon 08/07/2011  . Diverticulosis of colon (without mention of hemorrhage) 08/07/2011    Lyndee Hensen, PT, DPT 11:19 AM  08/05/18  [facility] [address] [place], [place], [address] Phone: [phone]   Fax:  [phone]  Name: Jack Huerta MRN: [id number] Date of Birth:  1945-12-21

## 2018-08-05 NOTE — Therapy (Signed)
[facility] [address] [place], [place], [address] Phone: [phone]   Fax:  [phone]  Physical Therapy Treatment  Patient Details  Name: Jack Huerta MRN: [id number] Date of Birth: [birth year] Referring Provider (PT): Marita Snellen   Encounter Date: 07/30/2018  PT End of Session - 08/05/18 1105    Visit Number  5    Number of Visits  12    Date for PT Re-Evaluation  08/14/18    Authorization Type  Aetna Medicare    PT Start Time  6389    PT Stop Time  1515    PT Time Calculation (min)  42 min    Activity Tolerance  Patient tolerated treatment well    Behavior During Therapy  [facility] for tasks assessed/performed       Past Medical History:  Diagnosis Date  . Chicken pox   . False positive serological test for hepatitis C   . [place] measles   . Hemorrhoids   . Mumps   . SVT (supraventricular tachycardia) ([place]), exercise induced 1993  . Tinnitus     Past Surgical History:  Procedure Laterality Date  . APPENDECTOMY  1958  . INGUINAL HERNIA REPAIR Left 04/2010  . INGUINAL HERNIA REPAIR Right 04/2001  . INGUINAL HERNIA REPAIR Left 1973  . TONSILLECTOMY  1962    There were no vitals filed for this visit.  Subjective Assessment - 08/05/18 1103    Subjective  Pt states mild pain improvments, but still has pain with increased activity, standing , walking, and initial standing.     Currently in Pain?  Yes    Pain Score  3     Pain Location  Hip    Pain Orientation  Right    Pain Descriptors / Indicators  Burning;Aching    Pain Type  Acute pain    Pain Onset  More than a month ago    Pain Frequency  Intermittent    Aggravating Factors   lateral motion with hip, standing, walking. end of day                        [facility] Adult PT Treatment/Exercise - 08/05/18 0001      Exercises   Exercises  Knee/Hip      Knee/Hip Exercises: Stretches   Active Hamstring Stretch  3 reps;30 seconds    Active  Hamstring Stretch Limitations  seated    Other Knee/Hip Stretches  SKTC 30 sec x3;  LTR x15;       Knee/Hip Exercises: Aerobic   Stationary Bike  L2 x 7 min      Knee/Hip Exercises: Standing   Hip Abduction  2 sets;10 reps;Both      Knee/Hip Exercises: Seated   Sit to Sand  10 reps      Knee/Hip Exercises: Supine   Bridges  20 reps    Other Supine Knee/Hip Exercises  Clam GTB x20 ;       Manual Therapy   Manual Therapy  Soft tissue mobilization;Passive ROM;Joint mobilization    Joint Mobilization  Long leg distraction on R (hip pump) ;  Lumber PA mobs gr 3; Lumbar s/l mobs on R;  Hip PA mobs (mod fig 4 ) in prone;     Passive ROM  For R hip,               PT Short Term Goals - 08/05/18 1105      PT SHORT  TERM GOAL #1   Title  Pt to be independent with initial HEP     Time  2    Period  Weeks    Status  Achieved    Target Date  07/17/18      PT SHORT TERM GOAL #2   Title  Pt to report decreased pain in R hip to 3/10 with activity     Time  2    Period  Weeks    Status  Achieved    Target Date  07/17/18        PT Long Term Goals - 07/06/18 1327      PT LONG TERM GOAL #1   Title  Pt to be independent with final HEP     Time  6    Period  Weeks    Status  New    Target Date  08/14/18      PT LONG TERM GOAL #2   Title  Pt to report decreased pain to 0-2/10 with activity in R hip .     Time  6    Period  Weeks    Status  New    Target Date  08/14/18      PT LONG TERM GOAL #3   Title  Pt to demo increased strength of R hip to be at least 4+/5 to improve stability and ambulation.     Time  6    Period  Weeks    Status  New    Target Date  08/14/18      PT LONG TERM GOAL #4   Title  Pt to demo ability for ambulation, up to 1 mi, without pain greater than 2/10 in R hip, to improve ability for community navigation.     Time  6    Period  Weeks    Status  New    Target Date  08/14/18            Plan - 08/05/18 1106    Clinical Impression  Statement  Pt with no pain with lumbar testing or mobilization today. He does have most pain with FABER testing for R hip, with pain reproduced in hip. Mobilization done for lumbar region as well as hip today. Expect Hip OA likely cause of pain, reinforced ROM and mobility for hip and back. Continued strengthening and stabilitation for core an hips with ther ex, discussed HEP.     Rehab Potential  Good    PT Frequency  2x / week    PT Duration  6 weeks    PT Treatment/Interventions  ADLs/Self Care Home Management;Cryotherapy;Electrical Stimulation;Iontophoresis 4mg /ml Dexamethasone;Functional mobility training;Stair training;Gait training;Ultrasound;Traction;Moist Heat;Therapeutic activities;Therapeutic exercise;Balance training;Neuromuscular re-education;Patient/family education;Passive range of motion;Manual techniques;Dry needling;Taping;Spinal Manipulations;Joint Manipulations    Consulted and Agree with Plan of Care  Patient       Patient will benefit from skilled therapeutic intervention in order to improve the following deficits and impairments:  Abnormal gait, Decreased range of motion, Difficulty walking, Increased muscle spasms, Decreased endurance, Decreased activity tolerance, Pain, Hypomobility, Decreased balance, Impaired flexibility, Improper body mechanics, Decreased strength, Decreased mobility  Visit Diagnosis: Pain in right hip     Problem List Patient Active Problem List   Diagnosis Date Noted  . ASCVD (arteriosclerotic cardiovascular disease) > 10 06/05/2018  . Leg cramps 06/05/2018  . Pain in joint of right hip 07/26/2017  . Palpitations 07/26/2015  . Bradycardia 07/26/2015  . PSVT (paroxysmal supraventricular tachycardia) ([place]) 07/24/2014  . Special screening for  malignant neoplasms, colon 08/07/2011  . Diverticulosis of colon (without mention of hemorrhage) 08/07/2011    Lyndee Hensen, PT, DPT 11:10 AM  08/05/18    [facility] [place], [place], [address] Phone: [phone]   Fax:  [phone]  Name: SHADRACH BARTUNEK MRN: [id number] Date of Birth: 04-29-1946

## 2018-08-06 ENCOUNTER — Ambulatory Visit: Payer: Medicare HMO | Admitting: Physical Therapy

## 2018-08-06 ENCOUNTER — Other Ambulatory Visit: Payer: Self-pay

## 2018-08-06 ENCOUNTER — Encounter: Payer: Self-pay | Admitting: Physical Therapy

## 2018-08-06 DIAGNOSIS — M25551 Pain in right hip: Secondary | ICD-10-CM

## 2018-08-06 NOTE — Therapy (Signed)
[facility] [address] [place], [place], [address] Phone: [phone]   Fax:  [phone]  Physical Therapy Treatment  Patient Details  Name: Jack Huerta MRN: [id number] Date of Birth: Mar 20, 1946 Referring Provider (PT): Marita Snellen   Encounter Date: 08/06/2018  PT End of Session - 08/06/18 1703    Visit Number  7    Number of Visits  12    Date for PT Re-Evaluation  08/14/18    Authorization Type  [employer] Medicare    PT Start Time  1430    PT Stop Time  1515    PT Time Calculation (min)  45 min    Activity Tolerance  Patient tolerated treatment well    Behavior During Therapy  [facility] for tasks assessed/performed       Past Medical History:  Diagnosis Date  . Chicken pox   . False positive serological test for hepatitis C   . [place] measles   . Hemorrhoids   . Mumps   . SVT (supraventricular tachycardia) ([place]), exercise induced 1993  . Tinnitus     Past Surgical History:  Procedure Laterality Date  . APPENDECTOMY  1958  . INGUINAL HERNIA REPAIR Left 04/2010  . INGUINAL HERNIA REPAIR Right 04/2001  . INGUINAL HERNIA REPAIR Left 1973  . TONSILLECTOMY  1962    There were no vitals filed for this visit.  Subjective Assessment - 08/06/18 1702    Subjective  Pt states minimal pain with light activity at home and resting, but increased pain with increased level of activity.     Currently in Pain?  Yes    Pain Score  4     Pain Location  Hip    Pain Orientation  Right    Pain Descriptors / Indicators  Aching    Pain Type  Acute pain    Pain Onset  More than a month ago    Pain Frequency  Intermittent                       [facility] Adult PT Treatment/Exercise - 08/06/18 1436      Exercises   Exercises  Knee/Hip      Knee/Hip Exercises: Stretches   Active Hamstring Stretch  3 reps;30 seconds    Active Hamstring Stretch Limitations  seated    Other Knee/Hip Stretches  --    Other Knee/Hip  Stretches  Side glides at wall to L x20;       Knee/Hip Exercises: Aerobic   Stationary Bike  L2 x 10 min      Knee/Hip Exercises: Standing   Hip Flexion  20 reps;Knee bent    Hip Abduction  2 sets;10 reps;Both    Other Standing Knee Exercises  Walk/march 10 ft x4;       Knee/Hip Exercises: Seated   Sit to Sand  15 reps      Knee/Hip Exercises: Supine   Constance Haw  --    Bridges with Clamshell  20 reps    Other Supine Knee/Hip Exercises  Clam GTB x20 ;     Other Supine Knee/Hip Exercises  Bicycle (high/mid range) x15;       Manual Therapy   Manual Therapy  Soft tissue mobilization;Passive ROM;Joint mobilization    Joint Mobilization  Long leg distraction on R (hip pump) ; hip post mobs;     Passive ROM  For R hip, with over pressure for flexion;  PT Short Term Goals - 08/05/18 1105      PT SHORT TERM GOAL #1   Title  Pt to be independent with initial HEP     Time  2    Period  Weeks    Status  Achieved    Target Date  07/17/18      PT SHORT TERM GOAL #2   Title  Pt to report decreased pain in R hip to 3/10 with activity     Time  2    Period  Weeks    Status  Achieved    Target Date  07/17/18        PT Long Term Goals - 07/06/18 1327      PT LONG TERM GOAL #1   Title  Pt to be independent with final HEP     Time  6    Period  Weeks    Status  New    Target Date  08/14/18      PT LONG TERM GOAL #2   Title  Pt to report decreased pain to 0-2/10 with activity in R hip .     Time  6    Period  Weeks    Status  New    Target Date  08/14/18      PT LONG TERM GOAL #3   Title  Pt to demo increased strength of R hip to be at least 4+/5 to improve stability and ambulation.     Time  6    Period  Weeks    Status  New    Target Date  08/14/18      PT LONG TERM GOAL #4   Title  Pt to demo ability for ambulation, up to 1 mi, without pain greater than 2/10 in R hip, to improve ability for community navigation.     Time  6    Period  Weeks     Status  New    Target Date  08/14/18            Plan - 08/06/18 1704    Clinical Impression Statement  Pt with radicular pain reproduced with hip motions and hip flexion today. Pt with pain with hip IR/scour and with FABER. Pt with questions on other pain treatments/injections, discussed following up with MD in a few weeks if pain does not improve, to discuss other options.     Rehab Potential  Good    PT Frequency  2x / week    PT Duration  6 weeks    PT Treatment/Interventions  ADLs/Self Care Home Management;Cryotherapy;Electrical Stimulation;Iontophoresis 4mg /ml Dexamethasone;Functional mobility training;Stair training;Gait training;Ultrasound;Traction;Moist Heat;Therapeutic activities;Therapeutic exercise;Balance training;Neuromuscular re-education;Patient/family education;Passive range of motion;Manual techniques;Dry needling;Taping;Spinal Manipulations;Joint Manipulations    Consulted and Agree with Plan of Care  Patient       Patient will benefit from skilled therapeutic intervention in order to improve the following deficits and impairments:  Abnormal gait, Decreased range of motion, Difficulty walking, Increased muscle spasms, Decreased endurance, Decreased activity tolerance, Pain, Hypomobility, Decreased balance, Impaired flexibility, Improper body mechanics, Decreased strength, Decreased mobility  Visit Diagnosis: Pain in right hip     Problem List Patient Active Problem List   Diagnosis Date Noted  . ASCVD (arteriosclerotic cardiovascular disease) > 10 06/05/2018  . Leg cramps 06/05/2018  . Pain in joint of right hip 07/26/2017  . Palpitations 07/26/2015  . Bradycardia 07/26/2015  . PSVT (paroxysmal supraventricular tachycardia) ([place]) 07/24/2014  . Special screening for malignant neoplasms, colon 08/07/2011  .  Diverticulosis of colon (without mention of hemorrhage) 08/07/2011    Lyndee Hensen, PT, DPT 5:06 PM  08/06/18    [place], [place], [address] Phone: [phone]   Fax:  [phone]  Name: Jack Huerta MRN: [id number] Date of Birth: Apr 24, 1946

## 2018-08-08 ENCOUNTER — Other Ambulatory Visit: Payer: Self-pay

## 2018-08-08 ENCOUNTER — Ambulatory Visit: Payer: Medicare HMO | Admitting: Physical Therapy

## 2018-08-08 ENCOUNTER — Encounter: Payer: Self-pay | Admitting: Physical Therapy

## 2018-08-08 ENCOUNTER — Encounter: Payer: Medicare HMO | Admitting: Physical Therapy

## 2018-08-08 ENCOUNTER — Telehealth: Payer: Self-pay

## 2018-08-08 DIAGNOSIS — M25551 Pain in right hip: Secondary | ICD-10-CM

## 2018-08-08 NOTE — Therapy (Addendum)
[facility] [address] [place], [place], [address] Phone: [phone]   Fax:  [phone]  Physical Therapy Treatment  Patient Details  Name: ROGERIO BOUTELLE MRN: [id number] Date of Birth: 10/20/1945 Referring Provider (PT): Marita Snellen   Encounter Date: 08/08/2018  PT End of Session - 08/08/18 1350    Visit Number  8    Number of Visits  12    Date for PT Re-Evaluation  08/14/18    Authorization Type  A[employer]na Medicare    PT Start Time  0160    PT Stop Time  1428    PT Time Calculation (min)  46 min    Activity Tolerance  Patient tolerated treatment well    Behavior During Therapy  [facility] for tasks assessed/performed       Past Medical History:  Diagnosis Date  . Chicken pox   . False positive serological test for hepatitis C   . [place] measles   . Hemorrhoids   . Mumps   . SVT (supraventricular tachycardia) ([place]), exercise induced 1993  . Tinnitus     Past Surgical History:  Procedure Laterality Date  . APPENDECTOMY  1958  . INGUINAL HERNIA REPAIR Left 04/2010  . INGUINAL HERNIA REPAIR Right 04/2001  . INGUINAL HERNIA REPAIR Left 1973  . TONSILLECTOMY  1962    There were no vitals filed for this visit.  Subjective Assessment - 08/08/18 1346    Subjective  Pt states increased pain in L lumbar region today, and increased pain in R hip/thigh. He did "work outsideTherapist, occupational.     Currently in Pain?  Yes    Pain Score  3     Pain Location  Back    Pain Orientation  Left    Pain Descriptors / Indicators  Aching    Pain Type  Acute pain    Pain Onset  Yesterday    Pain Frequency  Intermittent    Multiple Pain Sites  Yes    Pain Score  4    Pain Location  Hip    Pain Orientation  Right    Pain Type  Acute pain    Pain Onset  More than a month ago    Pain Frequency  Intermittent                       [facility] Adult PT Treatment/Exercise - 08/08/18 1351      Exercises   Exercises  Knee/Hip      Knee/Hip Exercises: Stretches   Active Hamstring Stretch  3 reps;30 seconds    Active Hamstring Stretch Limitations  seated    Other Knee/Hip Stretches  Single knee to chest 30 sec x2;  Lower trunk rotation x10;     Other Knee/Hip Stretches  --      Knee/Hip Exercises: Aerobic   Stationary Bike  L2 x 8 min      Knee/Hip Exercises: Standing   Hip Flexion  --    Hip Abduction  2 sets;10 reps;Both    Other Standing Knee Exercises  --      Knee/Hip Exercises: Seated   Sit to Sand  15 reps      Knee/Hip Exercises: Supine   Bridges with Clamshell  --    Other Supine Knee/Hip Exercises  Clam GTB x20 ; Bent knee raise x20    Other Supine Knee/Hip Exercises  --      Manual Therapy   Manual Therapy  Soft  tissue mobilization;Passive ROM;Joint mobilization    Joint Mobilization  Long leg distraction on R (hip pump) ; Lumbar PA mobs (prone) and sidelying mobs     Soft tissue mobilization  STM/DTM to L lumbar paraspinals    Passive ROM  --               PT Short Term Goals - 08/05/18 1105      PT SHORT TERM GOAL #1   Title  Pt to be independent with initial HEP     Time  2    Period  Weeks    Status  Achieved    Target Date  07/17/18      PT SHORT TERM GOAL #2   Title  Pt to report decreased pain in R hip to 3/10 with activity     Time  2    Period  Weeks    Status  Achieved    Target Date  07/17/18        PT Long Term Goals - 07/06/18 1327      PT LONG TERM GOAL #1   Title  Pt to be independent with final HEP     Time  6    Period  Weeks    Status  New    Target Date  08/14/18      PT LONG TERM GOAL #2   Title  Pt to report decreased pain to 0-2/10 with activity in R hip .     Time  6    Period  Weeks    Status  New    Target Date  08/14/18      PT LONG TERM GOAL #3   Title  Pt to demo increased strength of R hip to be at least 4+/5 to improve stability and ambulation.     Time  6    Period  Weeks    Status  New    Target Date  08/14/18      PT LONG  TERM GOAL #4   Title  Pt to demo ability for ambulation, up to 1 mi, without pain greater than 2/10 in R hip, to improve ability for community navigation.     Time  6    Period  Weeks    Status  New    Target Date  08/14/18            Plan - 08/08/18 1540    Clinical Impression Statement  Pt with increased soreness in L lumbar musculature(erector spinae). STM/DTM and joint mobilization done to improve pain. Pt with improved posture and ability for ambulation at end of session today. Pt presented with increased forward, and R lateral trunk lean with gait. Discussed a couple exercises to avoid if back is painful in next couple days. Pt with questions on sleeping positioning, reviewed in detial today for lumbar and hip pain. Pt with difficulty quantifying hip/LE pain, or rating frequency and intensity of pain on a daily basis. He appears to continue to have bothersome pain in R hip and LE, with increased activity. Expect that new lumbar pain is likley muscle strain from increased yard work.      Rehab Potential  Good    PT Frequency  2x / week    PT Duration  6 weeks    PT Treatment/Interventions  ADLs/Self Care Home Management;Cryotherapy;Electrical Stimulation;Iontophoresis '4mg'$ /ml Dexamethasone;Functional mobility training;Stair training;Gait training;Ultrasound;Traction;Moist Heat;Therapeutic activities;Therapeutic exercise;Balance training;Neuromuscular re-education;Patient/family education;Passive range of motion;Manual techniques;Dry needling;Taping;Spinal Manipulations;Joint Manipulations  Consulted and Agree with Plan of Care  Patient       Patient will benefit from skilled therapeutic intervention in order to improve the following deficits and impairments:  Abnormal gait, Decreased range of motion, Difficulty walking, Increased muscle spasms, Decreased endurance, Decreased activity tolerance, Pain, Hypomobility, Decreased balance, Impaired flexibility, Improper body mechanics,  Decreased strength, Decreased mobility  Visit Diagnosis: Pain in right hip     Problem List Patient Active Problem List   Diagnosis Date Noted  . ASCVD (arteriosclerotic cardiovascular disease) > 10 06/05/2018  . Leg cramps 06/05/2018  . Pain in joint of right hip 07/26/2017  . Palpitations 07/26/2015  . Bradycardia 07/26/2015  . PSVT (paroxysmal supraventricular tachycardia) ([place]) 07/24/2014  . Special screening for malignant neoplasms, colon 08/07/2011  . Diverticulosis of colon (without mention of hemorrhage) 08/07/2011   Lyndee Hensen, PT, DPT 3:46 PM  08/08/18    [facility] [place], [place], [address] Phone: [phone]   Fax:  [phone]  Name: TOWNSEND CUDWORTH MRN: [id number] Date of Birth: 1945/08/12     PHYSICAL THERAPY DISCHARGE SUMMARY  Visits from Start of Care: 8  Plan: Patient agrees to discharge.  Patient goals were partially met. Patient is being discharged due to not returning since the last visit.  ?????     PT discontinued to to clinic closings with COVID-19. Pt called to return to clinic, and has not called or scheduled appt, has not returned. Will d/c at this time.   Lyndee Hensen, PT, DPT 1:44 PM  10/21/18

## 2018-08-08 NOTE — Telephone Encounter (Signed)
Called pt and reviewed history. Pt agreeable to postpone 3/26 appt. Pt reports no cardiac symptoms. Advised pt to call back with any cardiac symptoms that may present. Pt verbalized understanding. Will route to COVID cancel pool.

## 2018-08-13 ENCOUNTER — Encounter: Payer: Medicare HMO | Admitting: Physical Therapy

## 2018-08-15 ENCOUNTER — Encounter: Payer: Medicare HMO | Admitting: Physical Therapy

## 2018-08-15 ENCOUNTER — Ambulatory Visit: Payer: Medicare HMO | Admitting: Cardiology

## 2018-08-20 ENCOUNTER — Encounter: Payer: Medicare HMO | Admitting: Physical Therapy

## 2018-09-09 ENCOUNTER — Encounter: Payer: Self-pay | Admitting: *Deleted

## 2018-09-09 ENCOUNTER — Other Ambulatory Visit: Payer: Self-pay | Admitting: Cardiology

## 2018-09-09 NOTE — Telephone Encounter (Signed)
Pt contacted to reschedule appt that was cancelled due to Covid 19 with Dr. Marlou Porch in March. Pt wanted to see Dr. Marlou Porch and has agreed to a virtual visit. Visit information and consent sent via mychart.  Pt thanked me for the call.

## 2018-09-11 ENCOUNTER — Other Ambulatory Visit: Payer: Self-pay

## 2018-09-11 ENCOUNTER — Encounter: Payer: Self-pay | Admitting: Cardiology

## 2018-09-11 ENCOUNTER — Telehealth (INDEPENDENT_AMBULATORY_CARE_PROVIDER_SITE_OTHER): Payer: Medicare HMO | Admitting: Cardiology

## 2018-09-11 VITALS — Ht 69.0 in

## 2018-09-11 DIAGNOSIS — M79604 Pain in right leg: Secondary | ICD-10-CM

## 2018-09-11 DIAGNOSIS — M79605 Pain in left leg: Secondary | ICD-10-CM

## 2018-09-11 DIAGNOSIS — I471 Supraventricular tachycardia: Secondary | ICD-10-CM | POA: Diagnosis not present

## 2018-09-11 DIAGNOSIS — R001 Bradycardia, unspecified: Secondary | ICD-10-CM

## 2018-09-11 NOTE — Progress Notes (Signed)
Virtual Visit via Video Note   This visit type was conducted due to national recommendations for restrictions regarding the COVID-19 Pandemic (e.g. social distancing) in an effort to limit this patient's exposure and mitigate transmission in our community.  Due to his co-morbid illnesses, this patient is at least at moderate risk for complications without adequate follow up.  This format is felt to be most appropriate for this patient at this time.  All issues noted in this document were discussed and addressed.  A limited physical exam was performed with this format.  Please refer to the patient's chart for his consent to telehealth for [facility].   Evaluation Performed:  Follow-up visit  Date:  09/11/2018   ID:  Jack Huerta, DOB 20-Oct-1945, MRN [id number]  Patient Location: Home Provider Location: Home  PCP:  [name], DO  Cardiologist:  Candee Furbish, MD  Electrophysiologist:  None   Chief Complaint: Palpitation follow-up  History of Present Illness:    Jack Huerta is a 73 y.o. male with atrial fibrillation/paroxysmal SVT, palpitations here for follow-up.  Previously Holter monitor showed frequent PACs up to 16,000.  Echocardiogram showed normal EF with mild mitral regurgitation.  Exercise treadmill test in October 2013 where he ran for 12 minutes was excellent but immediately after stopping he had a brief episode of SVT with heart rate of 180 bpm.  At last visit 08/09/2017 he was complaining of some leg pain and pain down his buttock right greater than left.  The patient does not have symptoms concerning for COVID-19 infection (fever, chills, cough, or new shortness of breath).    Past Medical History:  Diagnosis Date  . Chicken pox   . False positive serological test for hepatitis C   . [place] measles   . Hemorrhoids   . Mumps   . SVT (supraventricular tachycardia) ([place]), exercise induced 1993  . Tinnitus    Past Surgical History:  Procedure  Laterality Date  . APPENDECTOMY  1958  . INGUINAL HERNIA REPAIR Left 04/2010  . INGUINAL HERNIA REPAIR Right 04/2001  . INGUINAL HERNIA REPAIR Left 1973  . TONSILLECTOMY  1962     Current Meds  Medication Sig  . Ascorbic Acid (VITAMIN C) 1000 MG tablet Take 1,000 mg by mouth daily.  Marland Kitchen aspirin 81 MG tablet Take 81 mg by mouth daily.  Marland Kitchen atenolol (TENORMIN) 25 MG tablet Take 1 tablet (25 mg total) by mouth daily. Please schedule an appt for further refills,1st attempt  . Biotin 5 MG CAPS Take 5 mg by mouth daily.     Allergies:   Patient has no known allergies.   Social History   Tobacco Use  . Smoking status: Never Smoker  . Smokeless tobacco: Never Used  Substance Use Topics  . Alcohol use: No  . Drug use: No     Family Hx: The patient's family history includes Aneurysm in his father; Arthritis/Rheumatoid in his mother; Chronic fatigue in his brother; Leukemia in his sister; Prostate cancer in his father. There is no history of Colon cancer.  ROS:   Please see the history of present illness.     All other systems reviewed and are negative.   Prior CV studies:   The following studies were reviewed today:  Prior ECHO ETT reviewed  Labs/Other Tests and Data Reviewed:    EKG:  as below EKG on 08/09/2017 showed normal sinus rhythm with nonspecific ST-T wave changes incomplete right bundle branch block.  Previously he had  had sinus bradycardia rate 49-53  Recent Labs: 04/29/2018: Hemoglobin 14.6; Platelets 205.0; TSH 1.69 06/05/2018: ALT 18; BUN 23; Creatinine, Ser 0.99; Potassium 4.5; Sodium 136   Recent Lipid Panel Lab Results  Component Value Date/Time   CHOL 182 04/29/2018 02:39 PM   TRIG 41.0 04/29/2018 02:39 PM   HDL 77.90 04/29/2018 02:39 PM   CHOLHDL 2 04/29/2018 02:39 PM   LDLCALC 96 04/29/2018 02:39 PM    Wt Readings from Last 3 Encounters:  06/05/18 139 lb 12.8 oz (63.4 kg)  06/03/18 139 lb 12.8 oz (63.4 kg)  05/07/18 136 lb 3.2 oz (61.8 kg)      Objective:    Vital Signs:  Ht 5\' 9"  (1.753 m)   BMI 20.64 kg/m    VITAL SIGNS:  reviewed GEN:  no acute distress EYES:  sclerae anicteric, EOMI - Extraocular Movements Intact RESPIRATORY:  normal respiratory effort, symmetric expansion CARDIOVASCULAR:  no peripheral edema SKIN:  no rash, lesions or ulcers. MUSCULOSKELETAL:  no obvious deformities. NEURO:  alert and oriented x 3, no obvious focal deficit PSYCH:  normal affect  ASSESSMENT & PLAN:    Paroxysmal supraventricular tachycardia/palpitations - On atenolol.  Previously decreased to 25 mg a day because of bradycardia.  Seems to be doing quite well.  Heart rate can increase to 120 on treadmill.  Pain in right hip - Physical therapy. Dr. Paulla Fore and Juleen China have helped out. Discussed Tumeric. OK with me. Watch with bleeding.   He asked about CT calcium score. Discussed test. Stated that this may drive statin use if calcium present. He would like to continue with current prevention plan.   COVID-19 Education: The signs and symptoms of COVID-19 were discussed with the patient and how to seek care for testing (follow up with PCP or arrange E-visit).  The importance of social distancing was discussed today.  Time:   Today, I have spent 15 minutes with the patient with telehealth technology discussing the above problems.     Medication Adjustments/Labs and Tests Ordered: Current medicines are reviewed at length with the patient today.  Concerns regarding medicines are outlined above.   Tests Ordered: No orders of the defined types were placed in this encounter.   Medication Changes: No orders of the defined types were placed in this encounter.   Disposition:  Follow up in 1 year(s)  Signed, Candee Furbish, MD  09/11/2018 10:59 AM    [facility]

## 2018-09-11 NOTE — Patient Instructions (Signed)
Medication Instructions:  Your provider recommends that you continue on your current medications as directed. Please refer to the Current Medication list given to you today.    Labwork: None  Testing/Procedures: None  Follow-Up: Your provider wants you to follow-up in: 1 year with Dr. Marlou Porch. You will receive a reminder letter in the mail two months in advance. If you don't receive a letter, please call our office to schedule the follow-up appointment.    Any Other Special Instructions Will Be Listed Below (If Applicable).     If you need a refill on your cardiac medications before your next appointment, please call your pharmacy.

## 2018-09-25 DIAGNOSIS — H5203 Hypermetropia, bilateral: Secondary | ICD-10-CM | POA: Diagnosis not present

## 2018-09-25 DIAGNOSIS — H52203 Unspecified astigmatism, bilateral: Secondary | ICD-10-CM | POA: Diagnosis not present

## 2018-10-07 ENCOUNTER — Telehealth: Payer: Self-pay | Admitting: Physical Therapy

## 2018-10-07 NOTE — Telephone Encounter (Signed)
LVM about resuming physical therapy services.  Advised to call office if he would like to schedule an appointment.  Laureen Abrahams, PT, DPT 10/07/18 2:08 PM   [place], [place], [address] Phone: [phone]  Fax: [phone]

## 2018-10-21 NOTE — Progress Notes (Signed)
Virtual Visit via Video   Due to the COVID-19 pandemic, this visit was completed with telemedicine (audio/video) technology to reduce patient and provider exposure as well as to preserve personal protective equipment.   I connected with Jack Huerta by a video enabled telemedicine application and verified that I am speaking with the correct person using two identifiers. Location patient: Home Location provider: [facility], Office Persons participating in the virtual visit: PREET PERRIER, [name], DO Lonell Grandchild, CMA acting as scribe for Dr. [name].   I discussed the limitations of evaluation and management by telemedicine and the availability of in person appointments. The patient expressed understanding and agreed to proceed.  Care Team   Patient Care Team: [name], DO as PCP - General (Family Medicine) Jerline Pain, MD as PCP - Cardiology (Cardiology) [name], DO as Consulting Physician (Sports Medicine) Jerline Pain, MD as Consulting Physician (Cardiology)  Subjective:   HPI: Patient was having pain in right hip. He was sent to [facility] who recommended injection at that time he was not ready. He also was doing some PT that was helping but was stopped due to Covid.  Pain is now worse.  He continues to do exercises but has daily episodes of sharp pain in the right hip, especially with sitting.  He also endorses some shooting pain from his right low back to his knee.  No falls or trauma recently.  Review of Systems  Constitutional: Negative for chills and fever.  HENT: Negative for ear pain, hearing loss and tinnitus.   Eyes: Negative for blurred vision and double vision.  Respiratory: Negative for cough.   Cardiovascular: Negative for chest pain and palpitations.  Gastrointestinal: Negative for heartburn and nausea.  Genitourinary: Negative for dysuria, frequency and urgency.  Neurological: Negative for headaches.    Psychiatric/Behavioral: Negative for depression and suicidal ideas.    Patient Active Problem List   Diagnosis Date Noted  . Right sided sciatica 10/22/2018  . ASCVD (arteriosclerotic cardiovascular disease) > 10 06/05/2018  . Pain in joint of right hip 07/26/2017  . PSVT (paroxysmal supraventricular tachycardia) ([place]) 07/24/2014  . Diverticulosis of colon (without mention of hemorrhage) 08/07/2011    Social History   Tobacco Use  . Smoking status: Never Smoker  . Smokeless tobacco: Never Used  Substance Use Topics  . Alcohol use: No    Current Outpatient Medications:  .  Ascorbic Acid (VITAMIN C) 1000 MG tablet, Take 1,000 mg by mouth daily., Disp: , Rfl:  .  aspirin 81 MG tablet, Take 81 mg by mouth daily., Disp: , Rfl:  .  atenolol (TENORMIN) 25 MG tablet, Take 1 tablet (25 mg total) by mouth daily. Please schedule an appt for further refills,1st attempt, Disp: 90 tablet, Rfl: 0 .  Biotin 5 MG CAPS, Take 5 mg by mouth daily., Disp: , Rfl:  .  Turmeric 500 MG CAPS, Take by mouth., Disp: , Rfl:   No Known Allergies  Objective:   VITALS: Per patient if applicable, see vitals. GENERAL: Alert, appears well and in no acute distress. HEENT: Atraumatic, conjunctiva clear, no obvious abnormalities on inspection of external nose and ears. NECK: Normal movements of the head and neck. CARDIOPULMONARY: No increased WOB. Speaking in clear sentences. I:E ratio WNL.  MS: Moves all visible extremities without noticeable abnormality. PSYCH: Pleasant and cooperative, well-groomed. Speech normal rate and rhythm. Affect is appropriate. Insight and judgement are appropriate. Attention is focused, linear, and appropriate.  NEURO:  CN grossly intact. Oriented as arrived to appointment on time with no prompting. Moves both UE equally.  SKIN: No obvious lesions, wounds, erythema, or cyanosis noted on face or hands.  Depression screen [facility] 2/9 10/22/2018  Decreased Interest 0  Down, Depressed,  Hopeless 0  PHQ - 2 Score 0  Altered sleeping 0  Tired, decreased energy 0  Change in appetite 0  Feeling bad or failure about yourself  0  Trouble concentrating 0  Moving slowly or fidgety/restless 0  Suicidal thoughts 0  PHQ-9 Score 0  Difficult doing work/chores Not difficult at all    Assessment and Plan:   Winston was seen today for arthritis.  Diagnoses and all orders for this visit:  Pain in joint of right hip -     Ambulatory referral to Sports Medicine  Right sided sciatica  Lumbar degenerative disc disease   . COVID-19 Education: The signs and symptoms of COVID-19 were discussed with the patient and how to seek care for testing if needed. The importance of social distancing was discussed today. . Reviewed expectations re: course of current medical issues. . Discussed self-management of symptoms. . Outlined signs and symptoms indicating need for more acute intervention. . Patient verbalized understanding and all questions were answered. Marland Kitchen Health Maintenance issues including appropriate healthy diet, exercise, and smoking avoidance were discussed with patient. . See orders for this visit as documented in the electronic medical record.  [name], DO  Records requested if needed. Time spent: 25 minutes, of which >50% was spent in obtaining information about his symptoms, reviewing his previous labs, evaluations, and treatments, counseling him about his condition (please see the discussed topics above), and developing a plan to further investigate it; he had a number of questions which I addressed.

## 2018-10-22 ENCOUNTER — Other Ambulatory Visit: Payer: Self-pay

## 2018-10-22 ENCOUNTER — Ambulatory Visit (INDEPENDENT_AMBULATORY_CARE_PROVIDER_SITE_OTHER): Payer: Medicare HMO | Admitting: Family Medicine

## 2018-10-22 ENCOUNTER — Encounter: Payer: Self-pay | Admitting: Family Medicine

## 2018-10-22 VITALS — Ht 69.0 in | Wt 139.0 lb

## 2018-10-22 DIAGNOSIS — M25551 Pain in right hip: Secondary | ICD-10-CM | POA: Diagnosis not present

## 2018-10-22 DIAGNOSIS — M5136 Other intervertebral disc degeneration, lumbar region: Secondary | ICD-10-CM | POA: Diagnosis not present

## 2018-10-22 DIAGNOSIS — M5431 Sciatica, right side: Secondary | ICD-10-CM

## 2018-10-25 ENCOUNTER — Encounter: Payer: Self-pay | Admitting: Family Medicine

## 2018-10-28 ENCOUNTER — Ambulatory Visit (INDEPENDENT_AMBULATORY_CARE_PROVIDER_SITE_OTHER): Payer: Medicare HMO | Admitting: Family Medicine

## 2018-10-28 ENCOUNTER — Other Ambulatory Visit: Payer: Self-pay

## 2018-10-28 ENCOUNTER — Encounter: Payer: Self-pay | Admitting: Family Medicine

## 2018-10-28 DIAGNOSIS — M1611 Unilateral primary osteoarthritis, right hip: Secondary | ICD-10-CM | POA: Diagnosis not present

## 2018-10-28 DIAGNOSIS — G8929 Other chronic pain: Secondary | ICD-10-CM

## 2018-10-28 DIAGNOSIS — M545 Low back pain: Secondary | ICD-10-CM

## 2018-10-28 NOTE — Progress Notes (Signed)
Office Visit Note   Patient: Jack Huerta           Date of Birth: [birth year]           MRN: [id number] Visit Date: 10/28/2018 Requested by: [name], [address], [place] [address] PCP: [name], DO  Subjective: Chief Complaint  Patient presents with  . Right Hip - Pain    HPI: He is a 73 year old with right hip and leg pain.  Symptoms first started about a year and a half ago, no injury.  He went to another orthopedic clinic where he was told he had arthritis in his hip.  He was given a steroid Dosepak which helped significantly with that pain.  He continued to have some pain but it was much more tolerable, and then he eventually started having pain below his knee.  This pain is worse at the end of the day, especially when lying in bed, and typically wakes him up in the middle the night.  It is along the shin but is not specifically tender to palpation.  He has found that doing his piriformis stretches sometimes helps.  He has a history of low back problems since age 87 and has learned to manage this with a stretching regimen.  Lately he finds that his piriformis stretches are not as effective because he feels like his motion is limited when trying to pull his knee to his chest.  Years ago he was evaluated at a different orthopedic clinic for his chronic low back pain and he had an MRI scan which was reportedly unremarkable.  They were from 2014 but I was unable to find the images online.    He has made some dietary changes to limit inflammatory foods.  He thinks this has helped to some degree but not enough.  He states that he has been to a physical therapist for his hip pain and has made some progress as far as strength, but his lower leg pain has not resolved.  No personal or family history of gout or rheumatologic disease.                ROS: Denies fevers or chills or respiratory symptoms.  All other systems were reviewed and are negative.   Objective: Vital Signs: There were no vitals taken for this visit.  Physical Exam:  General:  Alert and oriented, in no acute distress. Pulm:  Breathing unlabored. Psy:  Normal mood, congruent affect. Skin: No visible rash. Right hip: Surprisingly very good range of motion in his hip with internal rotation of about 30 degrees and external about 50 degrees which is similar to his left hip.  There is a little bit of pain with passive internal rotation at the extreme.  No tenderness over the greater trochanter.  Negative straight leg raise, lower extremity strength and reflexes are normal.  Low back is nontender along the spine but he does have some tenderness in the region of the right piriformis muscle.  Imaging: Prior to injection, right anterior hip was visualized with musculoskeletal ultrasound.  He has a moderate to large effusion with thickening of the synovium but no hyperemia on power Doppler imaging.  He has a double contour sign on the femoral head suspicious for crystal arthropathy.  Previous right hip x-rays show moderate to severe DJD.  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   Assessment & Plan: 1.  Chronic right hip and leg pain, etiology not completely  certain.  His lower leg pain could indicate lumbar foraminal stenosis. -Discussed various options with patient, elected to try a diagnostic/therapeutic right hip injection today.  I will also aspirate and send the fluid for crystal analysis.  If he gets no relief whatsoever, then we will consider a new MRI scan of the lumbar spine followed by possible epidural steroid injection if indicated.     Procedures: Right hip aspiration and injection: After sterile prep with Betadine, using a 22-gauge spinal needle I aspirated 5 cc of clear yellow synovial fluid, then injected 8 cc 1% lidocaine without epinephrine and 40 mg methylprednisolone.    PMFS History: Patient Active Problem List   Diagnosis Date Noted  . Right sided sciatica  10/22/2018  . ASCVD (arteriosclerotic cardiovascular disease) > 10 06/05/2018  . Pain in joint of right hip 07/26/2017  . PSVT (paroxysmal supraventricular tachycardia) ([place]) 07/24/2014  . Diverticulosis of colon (without mention of hemorrhage) 08/07/2011   Past Medical History:  Diagnosis Date  . Chicken pox   . False positive serological test for hepatitis C   . [place] measles   . Hemorrhoids   . Mumps   . SVT (supraventricular tachycardia) ([place]), exercise induced 1993  . Tinnitus     Family History  Problem Relation Age of Onset  . Prostate cancer Father   . Aneurysm Father   . Arthritis/Rheumatoid Mother   . Leukemia Sister   . Chronic fatigue Brother   . Colon cancer Neg Hx     Past Surgical History:  Procedure Laterality Date  . APPENDECTOMY  1958  . INGUINAL HERNIA REPAIR Left 04/2010  . INGUINAL HERNIA REPAIR Right 04/2001  . INGUINAL HERNIA REPAIR Left 1973  . TONSILLECTOMY  1962   Social History   Occupational History    Employer: RETIRED  Tobacco Use  . Smoking status: Never Smoker  . Smokeless tobacco: Never Used  Substance and Sexual Activity  . Alcohol use: No  . Drug use: No  . Sexual activity: Not on file

## 2018-10-29 ENCOUNTER — Telehealth: Payer: Self-pay | Admitting: Family Medicine

## 2018-10-29 LAB — SYNOVIAL CELL COUNT + DIFF, W/ CRYSTALS
Basophils, %: 0 %
Eosinophils-Synovial: 0 % (ref 0–2)
Lymphocytes-Synovial Fld: 14 % (ref 0–74)
Monocyte/Macrophage: 8 % (ref 0–69)
Neutrophil, Synovial: 78 % — ABNORMAL HIGH (ref 0–24)
Synoviocytes, %: 0 % (ref 0–15)
WBC, Synovial: 8610 cells/uL — ABNORMAL HIGH (ref <150)

## 2018-10-29 NOTE — Telephone Encounter (Signed)
No crystals seen in hip fluid.

## 2018-10-30 ENCOUNTER — Encounter: Payer: Self-pay | Admitting: Family Medicine

## 2018-12-02 NOTE — Progress Notes (Signed)
Virtual Visit via Video   Due to the COVID-19 pandemic, this visit was completed with telemedicine (audio/video) technology to reduce patient and provider exposure as well as to preserve personal protective equipment.   I connected with Nelda Severe by a video enabled telemedicine application and verified that I am speaking with the correct person using two identifiers. Location patient: Home Location provider: [facility], Office Persons participating in the virtual visit: EMILE KYLLO, [name], DO Lonell Grandchild, CMA acting as scribe for Dr. [name].   I discussed the limitations of evaluation and management by telemedicine and the availability of in person appointments. The patient expressed understanding and agreed to proceed.  Care Team   Patient Care Team: [name], DO as PCP - General (Family Medicine) Jerline Pain, MD as PCP - Cardiology (Cardiology) [name], DO as Consulting Physician (Sports Medicine) Jerline Pain, MD as Consulting Physician (Cardiology)  Subjective:   HPI:   From note on 10/22/2018 Patient was having pain in right hip. He was sent to [facility] who recommended injection at that time he was not ready. He also was doing some PT that was helping but was stopped due to Covid.  Pain is now worse.  He continues to do exercises but has daily episodes of sharp pain in the right hip, especially with sitting.  He also endorses some shooting pain from his right low back to his knee.  No falls or trauma recently.  He did see Sports Medicine and tried intraarticular injection. Fluid analyzed as well. No gout. Did have WBC.  Patient is now interested in seeing an Orthopedist, hip specialty.   Review of Systems  Constitutional: Negative for chills and fever.  HENT: Negative for hearing loss and tinnitus.   Eyes: Negative for blurred vision and double vision.  Respiratory: Negative for cough.   Cardiovascular: Negative for chest  pain, palpitations and leg swelling.  Gastrointestinal: Negative for nausea and vomiting.  Genitourinary: Negative for frequency and urgency.  Musculoskeletal: Negative for myalgias.  Neurological: Negative for dizziness and headaches.  Psychiatric/Behavioral: Negative for depression and suicidal ideas.    Patient Active Problem List   Diagnosis Date Noted  . Right sided sciatica 10/22/2018  . ASCVD (arteriosclerotic cardiovascular disease) > 10 06/05/2018  . Pain in joint of right hip 07/26/2017  . PSVT (paroxysmal supraventricular tachycardia) ([place]) 07/24/2014  . Diverticulosis of colon (without mention of hemorrhage) 08/07/2011    Social History   Tobacco Use  . Smoking status: Never Smoker  . Smokeless tobacco: Never Used  Substance Use Topics  . Alcohol use: No   Current Outpatient Medications:  .  Ascorbic Acid (VITAMIN C) 1000 MG tablet, Take 1,000 mg by mouth daily., Disp: , Rfl:  .  aspirin 81 MG tablet, Take 81 mg by mouth daily., Disp: , Rfl:  .  atenolol (TENORMIN) 25 MG tablet, Take 1 tablet (25 mg total) by mouth daily. Please schedule an appt for further refills,1st attempt, Disp: 90 tablet, Rfl: 0 .  Biotin 5 MG CAPS, Take 5 mg by mouth daily., Disp: , Rfl:  .  Turmeric 500 MG CAPS, Take by mouth., Disp: , Rfl:   No Known Allergies  Objective:   VITALS: Per patient if applicable, see vitals. GENERAL: Alert, appears well and in no acute distress. HEENT: Atraumatic, conjunctiva clear, no obvious abnormalities on inspection of external nose and ears. NECK: Normal movements of the head and neck. CARDIOPULMONARY: No increased WOB. Speaking  in clear sentences. I:E ratio WNL.  MS: Moves all visible extremities without noticeable abnormality. PSYCH: Pleasant and cooperative, well-groomed. Speech normal rate and rhythm. Affect is appropriate. Insight and judgement are appropriate. Attention is focused, linear, and appropriate.  NEURO: CN grossly intact. Oriented as  arrived to appointment on time with no prompting. Moves both UE equally.  SKIN: No obvious lesions, wounds, erythema, or cyanosis noted on face or hands.  Depression screen [facility] 2/9 12/03/2018 10/22/2018  Decreased Interest 0 0  Down, Depressed, Hopeless 0 0  PHQ - 2 Score 0 0  Altered sleeping 0 0  Tired, decreased energy 0 0  Change in appetite 0 0  Feeling bad or failure about yourself  0 0  Trouble concentrating 0 0  Moving slowly or fidgety/restless 0 0  Suicidal thoughts 0 0  PHQ-9 Score 0 0  Difficult doing work/chores Not difficult at all Not difficult at all   Assessment and Plan:   Dylan was seen today for follow-up.  Diagnoses and all orders for this visit:  Pain in joint of right hip -     Ambulatory referral to Orthopedic Surgery  Right sided sciatica   . COVID-19 Education: The signs and symptoms of COVID-19 were discussed with the patient and how to seek care for testing if needed. The importance of social distancing was discussed today. . Reviewed expectations re: course of current medical issues. . Discussed self-management of symptoms. . Outlined signs and symptoms indicating need for more acute intervention. . Patient verbalized understanding and all questions were answered. Marland Kitchen Health Maintenance issues including appropriate healthy diet, exercise, and smoking avoidance were discussed with patient. . See orders for this visit as documented in the electronic medical record.  [name], DO  Records requested if needed. Time spent: 25 minutes, of which >50% was spent in obtaining information about his symptoms, reviewing his previous labs, evaluations, and treatments, counseling him about his condition (please see the discussed topics above), and developing a plan to further investigate it; he had a number of questions which I addressed.

## 2018-12-03 ENCOUNTER — Encounter: Payer: Self-pay | Admitting: Family Medicine

## 2018-12-03 ENCOUNTER — Ambulatory Visit (INDEPENDENT_AMBULATORY_CARE_PROVIDER_SITE_OTHER): Payer: Medicare HMO | Admitting: Family Medicine

## 2018-12-03 ENCOUNTER — Other Ambulatory Visit: Payer: Self-pay

## 2018-12-03 VITALS — Ht 69.0 in | Wt 139.0 lb

## 2018-12-03 DIAGNOSIS — M5431 Sciatica, right side: Secondary | ICD-10-CM

## 2018-12-03 DIAGNOSIS — M25551 Pain in right hip: Secondary | ICD-10-CM | POA: Diagnosis not present

## 2018-12-09 ENCOUNTER — Encounter: Payer: Self-pay | Admitting: Family Medicine

## 2018-12-09 ENCOUNTER — Telehealth: Payer: Self-pay | Admitting: Family Medicine

## 2018-12-09 NOTE — Telephone Encounter (Signed)
Can you please call patient and get them scheduled with Dr Ninfa Linden? Referred by Dr Junius Roads.

## 2018-12-09 NOTE — Telephone Encounter (Signed)
Patient is scheduled with Dr Ninfa Linden 12/11/2018 at 10:30am

## 2018-12-09 NOTE — Telephone Encounter (Signed)
Patient called asked what is his next plan of care? Patient said he thought he was being referred to a surgeon for his right hip. The number to contact patient is [phone]

## 2018-12-09 NOTE — Telephone Encounter (Signed)
Please advise 

## 2018-12-09 NOTE — Telephone Encounter (Signed)
Please schedule consult with CB.

## 2018-12-11 ENCOUNTER — Other Ambulatory Visit: Payer: Self-pay

## 2018-12-11 ENCOUNTER — Encounter: Payer: Self-pay | Admitting: Orthopaedic Surgery

## 2018-12-11 ENCOUNTER — Ambulatory Visit (INDEPENDENT_AMBULATORY_CARE_PROVIDER_SITE_OTHER): Payer: Medicare HMO | Admitting: Orthopaedic Surgery

## 2018-12-11 VITALS — Ht 69.0 in | Wt 144.0 lb

## 2018-12-11 DIAGNOSIS — M545 Low back pain, unspecified: Secondary | ICD-10-CM

## 2018-12-11 DIAGNOSIS — M79604 Pain in right leg: Secondary | ICD-10-CM

## 2018-12-11 DIAGNOSIS — M1611 Unilateral primary osteoarthritis, right hip: Secondary | ICD-10-CM | POA: Diagnosis not present

## 2018-12-11 NOTE — Progress Notes (Signed)
Office Visit Note   Patient: Jack Huerta           Date of Birth: Jun 20, 1945           MRN: [id number] Visit Date: 12/11/2018              Requested by: [name], [address],  [place] [address] PCP: [name], DO   Assessment & Plan: Visit Diagnoses:  1. Unilateral primary osteoarthritis, right hip   2. Lumbar pain with radiation down right leg     Plan: Given the burning sensation he is feeling in his right leg I do feel it warrants an MRI of the lumbar spine to rule out nerve compression or stenosis based on these exam findings.  We talked about hip replacement surgery right now were then hold off on that until we at least get a better idea of what is causing his radicular symptoms going down the right leg.  He agrees with this plan as well.  We will see him back after the MRI of his lumbar spine.  Follow-Up Instructions: Return in about 3 years (around 12/10/2021).   Orders:  No orders of the defined types were placed in this encounter.  No orders of the defined types were placed in this encounter.     Procedures: No procedures performed   Clinical Data: No additional findings.   Subjective: Chief Complaint  Patient presents with   Right Hip - Pain  Patient is a very pleasant 73 year old gentleman who comes in as referral from my partner Dr. Junius Roads to further evaluate and treat right hip osteoarthritis.  The patient does report significant right hip pain mainly that is activity related.  He also has stiffness when he first gets up.  He has had an intra-articular steroid injection in the right hip and he said his pain went away for 2 days completely and he was very excited about this but then his pain came back.  He still reports of burning sensation though that is been going on for a long period time between his knee down of his ankle on the lateral aspect of his right leg.  He has plain films of his lumbar spine and was worked up at one  time for potentially having some spine issues and he saw an orthopedic spine surgeon several years ago that did not feel he had any spine issues.  He does not like to take anti-inflammatories and he only takes one blood pressure medication.  He is not been on any type of medications for neuropathy as well and would not like to have any either.  He does report daily hip pain.  He says he has good days and bad days but states most days are uncomfortable from the right knee down to the right ankle with a burning sensation as a pills.  He also does report stiffness in his right hip and pelvic area.  An aspiration of his right hip joint showed no crystals but there was 8000 white cells which he was concerned about the inflammation.  HPI  Review of Systems He currently denies any headache, chest pain short of breath, fever, chills, nausea, vomiting  Objective: Vital Signs: Ht 5\' 9"  (1.753 m)    Wt 144 lb (65.3 kg)    BMI 21.27 kg/m   Physical Exam He is alert and orient x3 and in no acute distress.  He does not walk with an assistive device and is very  mobile. Ortho Exam Examination of his right hip today shows minimal pain with the extremes of internal and external rotation.  This pain is in the groin but there is no significant stiffness.  There is no blocks to rotation his rotation is full as well as his flexion extension of the right hip.  There is subjective numbness down the lateral aspect of his right leg.  He has 5 out of 5 strength in all muscle groups. Specialty Comments:  No specialty comments available.  Imaging: No results found. Plain films of the pelvis and right hip does show severe osteoarthritis with superior lateral joint space narrowing as well as cystic changes in the femoral head and periarticular osteophytes.  Plain films remotely of the lumbar spine do show loss of the lumbar lordosis and significant degenerative disc disease with the space narrowing at L5-S1 as well as some  higher levels.  PMFS History: Patient Active Problem List   Diagnosis Date Noted   Right sided sciatica 10/22/2018   ASCVD (arteriosclerotic cardiovascular disease) > 10 06/05/2018   Pain in joint of right hip 07/26/2017   PSVT (paroxysmal supraventricular tachycardia) ([place]) 07/24/2014   Diverticulosis of colon (without mention of hemorrhage) 08/07/2011   Past Medical History:  Diagnosis Date   Chicken pox    False positive serological test for hepatitis C    [place] measles    Hemorrhoids    Mumps    SVT (supraventricular tachycardia) ([place]), exercise induced 1993   Tinnitus     Family History  Problem Relation Age of Onset   Prostate cancer Father    Aneurysm Father    Arthritis/Rheumatoid Mother    Leukemia Sister    Chronic fatigue Brother    Colon cancer Neg Hx     Past Surgical History:  Procedure Laterality Date   APPENDECTOMY  1958   INGUINAL HERNIA REPAIR Left 04/2010   INGUINAL HERNIA REPAIR Right 04/2001   INGUINAL HERNIA REPAIR Left 1973   TONSILLECTOMY  1962   Social History   Occupational History    Employer: RETIRED  Tobacco Use   Smoking status: Never Smoker   Smokeless tobacco: Never Used  Substance and Sexual Activity   Alcohol use: No   Drug use: No   Sexual activity: Not on file

## 2018-12-16 ENCOUNTER — Other Ambulatory Visit: Payer: Self-pay | Admitting: Cardiology

## 2018-12-20 ENCOUNTER — Encounter: Payer: Self-pay | Admitting: Orthopaedic Surgery

## 2018-12-20 ENCOUNTER — Other Ambulatory Visit: Payer: Self-pay

## 2018-12-20 ENCOUNTER — Other Ambulatory Visit: Payer: Self-pay | Admitting: *Deleted

## 2018-12-20 DIAGNOSIS — M4807 Spinal stenosis, lumbosacral region: Secondary | ICD-10-CM

## 2018-12-20 MED ORDER — ATENOLOL 25 MG PO TABS: ORAL_TABLET | ORAL | 2 refills | Status: DC

## 2018-12-24 ENCOUNTER — Ambulatory Visit
Admission: RE | Admit: 2018-12-24 | Discharge: 2018-12-24 | Disposition: A | Discharge status: Home / Self Care | Payer: Medicare HMO | Source: Ambulatory Visit | Attending: Orthopaedic Surgery | Admitting: Orthopaedic Surgery

## 2018-12-24 DIAGNOSIS — M4807 Spinal stenosis, lumbosacral region: Secondary | ICD-10-CM

## 2018-12-24 DIAGNOSIS — M48061 Spinal stenosis, lumbar region without neurogenic claudication: Secondary | ICD-10-CM | POA: Diagnosis not present

## 2018-12-25 ENCOUNTER — Other Ambulatory Visit: Payer: Self-pay | Admitting: Student

## 2019-01-01 ENCOUNTER — Other Ambulatory Visit: Payer: Self-pay

## 2019-01-01 ENCOUNTER — Encounter: Payer: Self-pay | Admitting: Orthopaedic Surgery

## 2019-01-01 ENCOUNTER — Ambulatory Visit (INDEPENDENT_AMBULATORY_CARE_PROVIDER_SITE_OTHER): Payer: Medicare HMO | Admitting: Orthopaedic Surgery

## 2019-01-01 DIAGNOSIS — M1611 Unilateral primary osteoarthritis, right hip: Secondary | ICD-10-CM | POA: Diagnosis not present

## 2019-01-01 DIAGNOSIS — M4807 Spinal stenosis, lumbosacral region: Secondary | ICD-10-CM

## 2019-01-01 NOTE — Progress Notes (Signed)
The patient is well-known to me.  He does have well-documented osteoarthritis of his right.  He is tried and failed conservative treatment measures for over a year now.  He is even had an intra-articular steroid injection under ultrasound in his right hip that only helped for about a day.  His x-rays do show severe osteoarthritis of the right hip with joint space narrowing is especially the superior lateral aspect.  He has been having enough numbness and tingling though between his knee down to his ankle on the lateral aspect of his leg we sent him for an MRI of his lumbar spine to see if we could get an idea if there is any nerve compression.  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  He is an active 73 year old gentleman.  He still complains of numbness and tingling between his knee to his ankle he says is a burning type of sensation that he gets.  On exam he has pain with internal X rotation of his right hip.  He has negative straight leg raise on the right side.  He has subjective numbness on the lateral aspect of his right leg between his knee and his ankle.  His knee and foot and ankle exam are otherwise normal.  MRI of his lumbar spine shows degenerative disc disease at multiple levels but they do not describe nerve impingement.  However when I look at the MRI with him there are certainly some areas of of significant narrowing at L2-L3 and at L5-S1.  I am unsure as to whether or not this is enough compression to cause his radicular symptoms down the lateral aspect of his right leg.  I would like to send him to Dr. Ernestina Patches for an evaluation to determine whether or not a right-sided injection in the lumbar spine will be helpful as a transforaminal injection to see how this treats his radicular symptoms.  I will see him back myself in  about 3 months so we can set him up for his right hip replacement.  All question concerns were answered addressed.  We will also work on appoint with Dr. Ernestina Patches for an intervention in the lumbar spine.

## 2019-01-14 ENCOUNTER — Encounter: Payer: Self-pay | Admitting: Physical Medicine and Rehabilitation

## 2019-01-14 ENCOUNTER — Ambulatory Visit (INDEPENDENT_AMBULATORY_CARE_PROVIDER_SITE_OTHER): Payer: Medicare HMO | Admitting: Physical Medicine and Rehabilitation

## 2019-01-14 VITALS — BP 119/75 | HR 50

## 2019-01-14 DIAGNOSIS — M5136 Other intervertebral disc degeneration, lumbar region: Secondary | ICD-10-CM | POA: Diagnosis not present

## 2019-01-14 DIAGNOSIS — M25551 Pain in right hip: Secondary | ICD-10-CM | POA: Diagnosis not present

## 2019-01-14 DIAGNOSIS — M1611 Unilateral primary osteoarthritis, right hip: Secondary | ICD-10-CM | POA: Diagnosis not present

## 2019-01-14 NOTE — Progress Notes (Signed)
  Numeric Pain Rating Scale and Functional Assessment Average Pain 2 Pain Right Now 0 My pain is intermittent and burning Pain is worse with: some activites Pain improves with: rest   In the last MONTH (on 0-10 scale) has pain interfered with the following?  1. General activity like being  able to carry out your everyday physical activities such as walking, climbing stairs, carrying groceries, or moving a chair?  Rating(0)  2. Relation with others like being able to carry out your usual social activities and roles such as  activities at home, at work and in your community. Rating(0)  3. Enjoyment of life such that you have  been bothered by emotional problems such as feeling anxious, depressed or irritable?  Rating(1)

## 2019-01-15 ENCOUNTER — Encounter: Payer: Self-pay | Admitting: Physical Medicine and Rehabilitation

## 2019-01-15 NOTE — Progress Notes (Signed)
Jack Huerta - 73 y.o. male MRN [id number]  Date of birth: 12/26/1945  Office Visit Note: Visit Date: 01/14/2019 PCP: [name], DO Referred by: [name], DO  Subjective: Chief Complaint  Patient presents with   Right Lower Leg - Pain   HPI: Jack Huerta is a 73 y.o. male who comes in today For evaluation and management right hip and leg pain.  By way of review he was initially seen for hip pain in 2016 at [facility].  He was given medication and therapy treatments at the time with some mild relief.  Over that time he has had intermittent flareups from time to time with pain in the posterior hip as well as anterior hip along the groin region and into the thigh anteriorly.  Last year he was seen by his primary care physician Dr. Juleen China and was ultimately seen by Dr. Teresa Coombs in their practice.  He had osteopathic manipulation as well as physical therapy.  He did not get much relief from this and ultimately was seen in our office by Dr. Eunice Blase.  An ultrasound-guided hip injection was performed and the patient said that he obtained 100% relief for most of that day and could really do a lot of activities that prior to that he just could not do.  He was getting some pain down the lateral side of the leg past the knee and somewhat of a L5 distribution or possibly sciatic nerve type distribution.  He reports that that went away as well after the intra-articular hip injection.  Unfortunately he got no relief from the cortisone that was injected as he never obtained any other relief other than the first day.  He was seen by Dr. Jean Rosenthal from a hip standpoint and it did appear that they looked at eventually doing hip arthroplasty for pretty significant arthritis of the right hip.  Because of his complaints down the leg Dr. Ninfa Linden felt like there may be with some radicular component and did obtain an MRI of the lumbar spine.  I reviewed the lumbar spine  MRI with the patient today.  Dr. Ninfa Linden felt like perhaps there might be a need for interventional spine procedure.  The patient states that Dr. Ninfa Linden told him that his hip could not cause the pain down the leg.  Patient's complaints of pain are fairly mild she reports 2 out of 10 intermittent pain.  He has a little bit of a difficult time because he is trying to analyze the difference between the posterior lateral pain and the anterior pain.  He does ambulate with a limp.  He has pain in the right lower leg no numbness or tingling no paresthesia and no real back pain.  He reports rotating his leg particularly at the knee will have a burning type sensation posterior laterally.  He has no left-sided complaints.  MRI reviewed actually showed minimal changes for his age.  He has no central stenosis.  He has some left foraminal narrowing from small lateral disc at L4-5 but it is clearly left-sided.  He has mild arthritis of the lumbar spine.  He does have degenerative disc height loss with small retrolisthesis of L5 on S1 but no overt compression of the L5 nerve roots.  Review of Systems  Constitutional: Negative for chills, fever, malaise/fatigue and weight loss.  HENT: Negative for hearing loss and sinus pain.   Eyes: Negative for blurred vision, double vision and photophobia.  Respiratory: Negative for cough  and shortness of breath.   Cardiovascular: Negative for chest pain, palpitations and leg swelling.  Gastrointestinal: Negative for abdominal pain, nausea and vomiting.  Genitourinary: Negative for flank pain.  Musculoskeletal: Positive for joint pain. Negative for myalgias.       Right hip and leg pain  Skin: Negative for itching and rash.  Neurological: Negative for tremors, focal weakness and weakness.  Endo/Heme/Allergies: Negative.   Psychiatric/Behavioral: Negative for depression.  All other systems reviewed and are negative.  Otherwise per HPI.  Assessment & Plan: Visit Diagnoses:    1. Pain in right hip   2. Unilateral primary osteoarthritis, right hip   3. Other intervertebral disc degeneration, lumbar region     Plan: Findings:  History of off-and-on back pain but with more recent pain of the right hip and leg consistent with hip osteoarthritis with pain in the groin area worse with rotation into the thigh.  He does however have pain posterior laterally down the leg past the knee but without paresthesia.  I had a long conversation with him about patients I have seen with hip pathology that did seem to have a referral pattern more of a sciatic nerve pattern down the leg and that this could definitely be caused by referred pain from the hip joint although it is not the most common referral pattern.  I think diagnostically the fact that he had intra-articular injection with ultrasound guidance and almost 100% relief that is pretty clear indication that this is probably all coming from the hip.  He is scheduled to have hip replacement sometime after the first of the year I believe he said.  His pain really is not limiting him enough at this point to even really consider epidural injection either diagnostically.  There is no focal compression.  I really think he probably would do well with the hip replacement and I think probably the referral pattern would go away at that point at least that would prove that that was the main pain generator.  At any point if his pain worsened I would likely repeat hip intra-articular injection before I would try epidural injection.  If he did get paresthesias in the tingling sensation and it really seemed to be nerve tension obviously epidural injection could be performed anytime I did go over that with him.    Meds & Orders: No orders of the defined types were placed in this encounter.  No orders of the defined types were placed in this encounter.   Follow-up: Return if symptoms worsen or fail to improve.   Procedures: No procedures performed  No  notes on file   Clinical History: MRI LUMBAR SPINE WITHOUT CONTRAST  TECHNIQUE: Multiplanar, multisequence MR imaging of the lumbar spine was performed. No intravenous contrast was administered.  COMPARISON:  04/05/2013  FINDINGS: Segmentation:  Standard lumbar numbering  Alignment:  Mild levoscoliosis.  Slight retrolisthesis at L4-5.  Vertebrae: Chronic L2 inferior endplate Schmorl's node with mild marrow edema. Interval L2 superior endplate Schmorl's node with mild marrow edema. New minor marrow edema about the T11-12, T12-L1, and L3 superior endplates. No fracture, discitis, or aggressive bone lesion.  Conus medullaris and cauda equina: Conus extends to the L1 level. Conus and cauda equina appear normal.  Paraspinal and other soft tissues: Large right renal cystic intensity which was also seen on prior. There are smaller left renal cystic intensities.  Disc levels:  L1-L2: Disc narrowing and bulging with mild endplate ridging. No impingement  L2-L3: Moderate disc narrowing  with endplate degeneration, essentially stable. No impingement  L3-L4: Mild disc narrowing and desiccation with small endplate ridges. No impingement  L4-L5: Mild disc narrowing and desiccation with posterior annular fissure and mild endplate ridging.  L5-S1:Greatest level of degenerative disc narrowing with endplate ridging. Negative facets. No impingement  IMPRESSION: Generalized disc degeneration with mild discogenic endplate edema at the upper lumbar levels. The discogenic edema has of all since 2014 but degree of disc narrowing and ridging is essentially stable. No neural compression.   Electronically Signed   By: Monte Fantasia M.D.   On: 12/24/2018 11:37   He reports that he has never smoked. He has never used smokeless tobacco. No results for input(s): HGBA1C, LABURIC in the last 8760 hours.  Objective:  VS:  HT:     WT:    BMI:      BP:119/75   HR:(!) 50bpm    TEMP: ( )   RESP:  Physical Exam Vitals signs and nursing note reviewed.  Constitutional:      General: He is not in acute distress.    Appearance: Normal appearance. He is well-developed.  HENT:     Head: Normocephalic and atraumatic.  Eyes:     Conjunctiva/sclera: Conjunctivae normal.     Pupils: Pupils are equal, round, and reactive to light.  Neck:     Musculoskeletal: Normal range of motion and neck supple. No neck rigidity.  Cardiovascular:     Rate and Rhythm: Normal rate.     Pulses: Normal pulses.     Heart sounds: Normal heart sounds.  Pulmonary:     Effort: Pulmonary effort is normal. No respiratory distress.  Musculoskeletal:     Right lower leg: No edema.     Left lower leg: No edema.     Comments: Patient ambulates with antalgic gait to the right.  Perhaps mild Trendelenburg gait.  He has painful but stiff range of motion of the right hip.  He has a negative slump test bilaterally.  He has good distal strength.  Skin:    General: Skin is warm and dry.     Findings: No erythema or rash.  Neurological:     General: No focal deficit present.     Mental Status: He is alert and oriented to person, place, and time.     Sensory: No sensory deficit.     Coordination: Coordination normal.     Gait: Gait normal.  Psychiatric:        Mood and Affect: Mood normal.        Behavior: Behavior normal.     Ortho Exam Imaging: No results found.  Past Medical/Family/Surgical/Social History: Medications & Allergies reviewed per EMR, new medications updated. Patient Active Problem List   Diagnosis Date Noted   Right sided sciatica 10/22/2018   ASCVD (arteriosclerotic cardiovascular disease) > 10 06/05/2018   Pain in joint of right hip 07/26/2017   PSVT (paroxysmal supraventricular tachycardia) ([facility]) 07/24/2014   Diverticulosis of colon (without mention of hemorrhage) 08/07/2011   Past Medical History:  Diagnosis Date   Chicken pox    False positive serological  test for hepatitis C    [place] measles    Hemorrhoids    Mumps    SVT (supraventricular tachycardia) ([facility]), exercise induced 1993   Tinnitus    Family History  Problem Relation Age of Onset   Prostate cancer Father    Aneurysm Father    Arthritis/Rheumatoid Mother    Leukemia Sister    Chronic fatigue  Brother    Colon cancer Neg Hx    Past Surgical History:  Procedure Laterality Date   APPENDECTOMY  1958   INGUINAL HERNIA REPAIR Left 04/2010   INGUINAL HERNIA REPAIR Right 04/2001   INGUINAL HERNIA REPAIR Left 1973   TONSILLECTOMY  1962   Social History   Occupational History    Employer: RETIRED  Tobacco Use   Smoking status: Never Smoker   Smokeless tobacco: Never Used  Substance and Sexual Activity   Alcohol use: No   Drug use: No   Sexual activity: Not on file

## 2019-01-24 ENCOUNTER — Encounter: Payer: Self-pay | Admitting: Orthopaedic Surgery

## 2019-01-24 NOTE — Telephone Encounter (Signed)
CB pt 

## 2019-02-06 ENCOUNTER — Encounter: Payer: Self-pay | Admitting: Orthopaedic Surgery

## 2019-02-18 ENCOUNTER — Ambulatory Visit (INDEPENDENT_AMBULATORY_CARE_PROVIDER_SITE_OTHER): Payer: Medicare HMO | Admitting: Orthopaedic Surgery

## 2019-02-18 DIAGNOSIS — M1611 Unilateral primary osteoarthritis, right hip: Secondary | ICD-10-CM | POA: Diagnosis not present

## 2019-02-18 NOTE — Progress Notes (Signed)
Office Visit Note   Patient: Jack Huerta           Date of Birth: [birth year]           MRN: [id number] Visit Date: 02/18/2019              Requested by: [name], [address] [place],  [place] [address] PCP: [name], DO   Assessment & Plan: Visit Diagnoses:  1. Unilateral primary osteoarthritis, right hip     Plan: We went over hip surgery in detail again and what hip replacement surgery involves.  We had a long and thorough discussion about the risk and benefits of surgery.  We talked about his interoperative and postoperative course.  He does live alone and will benefit from short-term skilled nursing right after his acute hospital stay.  We talked about what to expect and I showed him his x-rays as well as a hip model to explain what surgery involves.  All question concerns were answered and addressed.  We will work on getting this scheduled in the near future.  Follow-Up Instructions: Return for 2 weeks post-op.   Orders:  No orders of the defined types were placed in this encounter.  No orders of the defined types were placed in this encounter.     Procedures: No procedures performed   Clinical Data: No additional findings.   Subjective: Chief Complaint  Patient presents with  . Right Hip - Pain  The patient is well-known to [place].  He has known and well-documented osteoarthritis involving his right hip.  X-rays in January of this year did show complete loss of superior lateral joint space.  He is tried failed all forms conservative treatment including even an intra-articular injection.  He has been hurting for several years now.  He is worked on activity modification and taking anti-inflammatories.  He is work on hip strengthening exercises as well.  He has had information on hip replacement surgery and is at the point where his right hip pain and arthritis is detriment affecting his mobility, his quality of life and his activities daily living.   HPI  Review of Systems He currently denies any headache, chest pain, shortness of breath, fever, chills, nausea, vomiting  Objective: Vital Signs: There were no vitals taken for this visit.  Physical Exam He is alert and orient x3 and in no acute distress Ortho Exam Examination of his left hip is normal examination of his right hip shows pain with internal and external rotation.  There is no blocks to rotation but it is painful. Specialty Comments:  No specialty comments available.  Imaging: No results found. X-rays from earlier this year do show severe arthritis of the right hip.  The left hip appears normal.  The right hip has almost complete loss of the superior lateral joint space.  There are sclerotic changes as well as periarticular osteophytes.  PMFS History: Patient Active Problem List   Diagnosis Date Noted  . Unilateral primary osteoarthritis, right hip 02/18/2019  . Right sided sciatica 10/22/2018  . ASCVD (arteriosclerotic cardiovascular disease) > 10 06/05/2018  . Pain in joint of right hip 07/26/2017  . PSVT (paroxysmal supraventricular tachycardia) ([facility]) 07/24/2014  . Diverticulosis of colon (without mention of hemorrhage) 08/07/2011   Past Medical History:  Diagnosis Date  . Chicken pox   . False positive serological test for hepatitis C   . [place] measles   . Hemorrhoids   . Mumps   . SVT (  supraventricular tachycardia) ([facility]), exercise induced 1993  . Tinnitus     Family History  Problem Relation Age of Onset  . Prostate cancer Father   . Aneurysm Father   . Arthritis/Rheumatoid Mother   . Leukemia Sister   . Chronic fatigue Brother   . Colon cancer Neg Hx     Past Surgical History:  Procedure Laterality Date  . APPENDECTOMY  1958  . INGUINAL HERNIA REPAIR Left 04/2010  . INGUINAL HERNIA REPAIR Right 04/2001  . INGUINAL HERNIA REPAIR Left 1973  . TONSILLECTOMY  1962   Social History   Occupational History    Employer: RETIRED  Tobacco Use   . Smoking status: Never Smoker  . Smokeless tobacco: Never Used  Substance and Sexual Activity  . Alcohol use: No  . Drug use: No  . Sexual activity: Not on file

## 2019-02-19 DIAGNOSIS — R69 Illness, unspecified: Secondary | ICD-10-CM | POA: Diagnosis not present

## 2019-03-06 ENCOUNTER — Encounter: Payer: Self-pay | Admitting: Orthopaedic Surgery

## 2019-03-18 ENCOUNTER — Encounter: Payer: Self-pay | Admitting: Orthopaedic Surgery

## 2019-03-19 ENCOUNTER — Encounter: Payer: Self-pay | Admitting: Orthopaedic Surgery

## 2019-03-20 ENCOUNTER — Other Ambulatory Visit: Payer: Self-pay

## 2019-03-25 ENCOUNTER — Other Ambulatory Visit: Payer: Self-pay | Admitting: Physician Assistant

## 2019-03-25 NOTE — Pre-Procedure Instructions (Signed)
[facility] # [place], [address] [place] [address] Phone: [phone] Fax: [phone]    Your procedure is scheduled on Tues., Nov. 10, 2020 from 2:28PM-4:03PM  Report to [facility] Entrance "A" at 12:25PM  Call this number if you have problems the morning of surgery:  [phone]   Remember:  Do not eat or drink after midnight on Nov. 9th    Take these medicines the morning of surgery with A SIP OF WATER: Atenolol (TENORMIN)   Follow your surgeon's instructions on when to stop Aspirin.  If no instructions were given by your surgeon then you will need to call the office to get those instructions.     As of today, stop taking all Aspirin (unless instructed by your doctor) and Other Aspirin containing products, Vitamins, Fish oils, and Herbal medications. Also stop all NSAIDS i.e. Advil, Ibuprofen, Motrin, Aleve, Anaprox, Naproxen, BC, Goody Powders, and all Supplements.  No Smoking of any kind, Tobacco, or Alcohol products 24 hours prior to your procedure. If you use a Cpap at night, you may bring all equipment for your overnight stay.   Special instructions:  [facility]- Preparing For Surgery  Before surgery, you can play an important role. Because skin is not sterile, your skin needs to be as free of germs as possible. You can reduce the number of germs on your skin by washing with CHG (chlorahexidine gluconate) Soap before surgery.  CHG is an antiseptic cleaner which kills germs and bonds with the skin to continue killing germs even after washing.    Please do not use if you have an allergy to CHG or antibacterial soaps. If your skin becomes reddened/irritated stop using the CHG.  Do not shave (including legs and underarms) for at least 48 hours prior to first CHG shower. It is OK to shave your face.  Please follow these instructions carefully.   1. Shower the NIGHT BEFORE SURGERY and the MORNING OF SURGERY  with CHG.   2. If you chose to wash your hair, wash your hair first as usual with your normal shampoo.  3. After you shampoo, rinse your hair and body thoroughly to remove the shampoo.  4. Use CHG as you would any other liquid soap. You can apply CHG directly to the skin and wash gently with a scrungie or a clean washcloth.   5. Apply the CHG Soap to your body ONLY FROM THE NECK DOWN.  Do not use on open wounds or open sores. Avoid contact with your eyes, ears, mouth and genitals (private parts). Wash Face and genitals (private parts)  with your normal soap.  6. Wash thoroughly, paying special attention to the area where your surgery will be performed.  7. Thoroughly rinse your body with warm water from the neck down.  8. DO NOT shower/wash with your normal soap after using and rinsing off the CHG Soap.  9. Pat yourself dry with a CLEAN TOWEL.  10. Wear CLEAN PAJAMAS to bed the night before surgery, wear comfortable clothes the morning of surgery  11. Place CLEAN SHEETS on your bed the night of your first shower and DO NOT SLEEP WITH PETS.   Day of Surgery:            Remember to brush your teeth WITH YOUR REGULAR TOOTHPASTE.  Do not wear jewelry.  Do not wear lotions, powders, colognes, or deodorant.  Do not shave 48 hours prior to surgery.  Men may shave face and neck.  Do not bring valuables to the hospital.  [facility] is not responsible for any belongings or valuables.  Contacts, dentures or bridgework may not be worn into surgery.  For patients admitted to the hospital, discharge time will be determined by your treatment team.  Patients discharged the day of surgery will not be allowed to drive home, and someone age 74 and over needs to stay with them for 24 hours.  Please wear clean clothes to the hospital/surgery center.    Please read over the following fact sheets that you were given.

## 2019-03-26 ENCOUNTER — Other Ambulatory Visit: Payer: Self-pay

## 2019-03-26 ENCOUNTER — Encounter (HOSPITAL_COMMUNITY)
Admission: RE | Admit: 2019-03-26 | Discharge: 2019-03-26 | Disposition: A | Discharge status: Home / Self Care | Payer: Medicare HMO | Source: Ambulatory Visit | Attending: Orthopaedic Surgery | Admitting: Orthopaedic Surgery

## 2019-03-26 ENCOUNTER — Encounter (HOSPITAL_COMMUNITY): Payer: Self-pay

## 2019-03-26 DIAGNOSIS — Z01818 Encounter for other preprocedural examination: Secondary | ICD-10-CM | POA: Diagnosis not present

## 2019-03-26 DIAGNOSIS — R001 Bradycardia, unspecified: Secondary | ICD-10-CM | POA: Diagnosis not present

## 2019-03-26 DIAGNOSIS — I4510 Unspecified right bundle-branch block: Secondary | ICD-10-CM | POA: Insufficient documentation

## 2019-03-26 HISTORY — DX: Malignant (primary) neoplasm, unspecified: C80.1

## 2019-03-26 LAB — CBC
HCT: 46.1 % (ref 39.0–52.0)
Hemoglobin: 15 g/dL (ref 13.0–17.0)
MCH: 32.1 pg (ref 26.0–34.0)
MCHC: 32.5 g/dL (ref 30.0–36.0)
MCV: 98.5 fL (ref 80.0–100.0)
Platelets: 197 10*3/uL (ref 150–400)
RBC: 4.68 MIL/uL (ref 4.22–5.81)
RDW: 13.7 % (ref 11.5–15.5)
WBC: 5 10*3/uL (ref 4.0–10.5)
nRBC: 0 % (ref 0.0–0.2)

## 2019-03-26 LAB — ABO/RH: ABO/RH(D): A NEG

## 2019-03-26 LAB — SURGICAL PCR SCREEN
MRSA, PCR: NEGATIVE
Staphylococcus aureus: NEGATIVE

## 2019-03-26 LAB — BASIC METABOLIC PANEL
Anion gap: 7 (ref 5–15)
BUN: 24 mg/dL — ABNORMAL HIGH (ref 8–23)
CO2: 27 mmol/L (ref 22–32)
Calcium: 9 mg/dL (ref 8.9–10.3)
Chloride: 104 mmol/L (ref 98–111)
Creatinine, Ser: 1.03 mg/dL (ref 0.61–1.24)
GFR calc Af Amer: >60 mL/min (ref >60)
GFR calc non Af Amer: >60 mL/min (ref >60)
Glucose, Bld: 90 mg/dL (ref 70–99)
Potassium: 4.9 mmol/L (ref 3.5–5.1)
Sodium: 138 mmol/L (ref 135–145)

## 2019-03-26 LAB — TYPE AND SCREEN
ABO/RH(D): A NEG
Antibody Screen: NEGATIVE

## 2019-03-26 NOTE — Progress Notes (Signed)
PCP - [facility] Cardiologist - Dr. Pryor Curia  Chest x-ray - n/a EKG - 03-26-19 Stress Test - 2013 ECHO - 2013  Aspirin Instructions: last dose 03-25-19  ERAS Protcol - yes, given Ensure  COVID TEST- Friday, 03-28-19   Anesthesia review: yes, EKG  Patient denies shortness of breath, fever, cough and chest pain at PAT appointment   All instructions explained to the patient, with a verbal understanding of the material. Patient agrees to go over the instructions while at home for a better understanding. Patient also instructed to self quarantine after being tested for COVID-19. The opportunity to ask questions was provided.

## 2019-03-26 NOTE — Pre-Procedure Instructions (Signed)
[facility] # [place], [place] [address] Phone: [phone] Fax: [phone]    Your procedure is scheduled on Tues., Nov. 10th  Report to [facility] Entrance "A" at 12:25PM  Call this number if you have problems the morning of surgery:  [phone]   Remember:  Do not eat after midnight on Nov. 9th   You can drink clear liquids until 11:30 AM, the morning of surgery.  Clear liquids include: water, non-citric juices (without pulp), clear tea, black coffee,  Gatorade, carbonated beverages.   Enhanced Recovery after Surgery for Orthopedics Enhanced Recovery after Surgery is a protocol used to improve the stress on your body and your recovery after surgery.  Patient Instructions  . The night before surgery:  o No food after midnight. ONLY clear liquids after midnight  .  Marland Kitchen The day of surgery (if you do NOT have diabetes):  o Drink ONE (1) Pre-Surgery Clear Ensure as directed.   o This drink was given to you during your hospital  pre-op appointment visit. o The pre-op nurse will instruct you on the time to drink the  Pre-Surgery Ensure depending on your surgery time. o Finish the drink at the designated time by the pre-op nurse.  o Nothing else to drink after completing the  Pre-Surgery Clear Ensure.          If you have questions, please contact your surgeon's office.     Take these medicines the morning of surgery with A SIP OF WATER:  Atenolol (TENORMIN)   Follow your surgeon's instructions on when to stop Aspirin.  If no instructions were given by your surgeon then you will need to call the office to get those instructions.     As of today, stop taking all Aspirin (unless instructed by your doctor) and Other Aspirin containing products, Vitamins, Fish oils, and Herbal medications. Also stop all NSAIDS i.e. Advil, Ibuprofen, Motrin, Aleve, Anaprox, Naproxen, BC, Goody Powders, and all  Supplements.  No Smoking of any kind, Tobacco, or Alcohol products 24 hours prior to your procedure. If you use a Cpap at night, you may bring all equipment for your overnight stay.   Special instructions:  [place]- Preparing For Surgery  Before surgery, you can play an important role. Because skin is not sterile, your skin needs to be as free of germs as possible. You can reduce the number of germs on your skin by washing with CHG (chlorahexidine gluconate) Soap before surgery.  CHG is an antiseptic cleaner which kills germs and bonds with the skin to continue killing germs even after washing.    Please do not use if you have an allergy to CHG or antibacterial soaps. If your skin becomes reddened/irritated stop using the CHG.  Do not shave (including legs and underarms) for at least 48 hours prior to first CHG shower. It is OK to shave your face.  Please follow these instructions carefully.   1. Shower the NIGHT BEFORE SURGERY and the MORNING OF SURGERY with CHG.   2. If you chose to wash your hair, wash your hair first as usual with your normal shampoo.  3. After you shampoo, rinse your hair and body thoroughly to remove the shampoo.  4. Use CHG as you would any other liquid soap. You can apply CHG directly to the skin and wash gently with a scrungie or a clean washcloth.   5. Apply the CHG Soap to  your body ONLY FROM THE NECK DOWN.  Do not use on open wounds or open sores. Avoid contact with your eyes, ears, mouth and genitals (private parts). Wash Face and genitals (private parts)  with your normal soap.  6. Wash thoroughly, paying special attention to the area where your surgery will be performed.  7. Thoroughly rinse your body with warm water from the neck down.  8. DO NOT shower/wash with your normal soap after using and rinsing off the CHG Soap.  9. Pat yourself dry with a CLEAN TOWEL.  10. Wear CLEAN PAJAMAS to bed the night before surgery, wear comfortable clothes the  morning of surgery  11. Place CLEAN SHEETS on your bed the night of your first shower and DO NOT SLEEP WITH PETS.   Day of Surgery:            Remember to brush your teeth WITH YOUR REGULAR TOOTHPASTE.  Do not wear jewelry.  Do not wear lotions, powders, colognes, or deodorant.  Do not shave 48 hours prior to surgery.  Men may shave face and neck.  Do not bring valuables to the hospital.  [facility] is not responsible for any belongings or valuables.  Contacts, dentures or bridgework may not be worn into surgery.  For patients admitted to the hospital, discharge time will be determined by your treatment team.  Patients discharged the day of surgery will not be allowed to drive home, and someone age 39 and over needs to stay with them for 24 hours.  Please wear clean clothes to the hospital/surgery center.    Please read over the following fact sheets that you were given.

## 2019-03-27 NOTE — Progress Notes (Signed)
Anesthesia Chart Review: Follows yearly with Dr. Marlou Porch for hx of PSVT. Previously Holter monitor showed frequent PACs up to 16,000.  Echocardiogram showed normal EF with mild mitral regurgitation.  Exercise treadmill test in October 2013 where he ran for 12 minutes was excellent but immediately after stopping he had a brief episode of SVT with heart rate of 180 bpm. He was last seen 09/11/18. Per note " On atenolol.  Previously decreased to 25 mg a day because of bradycardia.  Seems to be doing quite well.  Heart rate can increase to 120 on treadmill." He was advised to f/u in 1 yr.  Preop labs reviewed, WNL.  EKG 03/26/19: Sinus bradycardia. Rate 49. Possible Left atrial enlargement. Incomplete right bundle branch block. No significant change since last tracing  Echo 2013: Conclusions: 1.  Normal LV size and function. 2.  Normal left ventricular ejection fraction estimated by 2D at 60 to 65%. 3.  There were no regional wall motion abnormalities. 4.  Borderline left atrial enlargement. 5.  Mildly thickened mitral valve. 6.  Trace mitral valve regurgitation. 7.  Mild tricuspid regurgitation.   Wynonia Musty [facility] Short Stay Center/Anesthesiology Phone [phone] 03/27/2019 2:20 PM

## 2019-03-27 NOTE — Anesthesia Preprocedure Evaluation (Addendum)
Anesthesia Evaluation  Patient identified by MRN, date of birth, ID band Patient awake    Reviewed: Allergy & Precautions, NPO status , Patient's Chart, lab work & pertinent test results, reviewed documented beta blocker date and time   Airway Mallampati: II  TM Distance: >3 FB     Dental  (+) Dental Advisory Given   Pulmonary neg recent URI,    breath sounds clear to auscultation       Cardiovascular + dysrhythmias Supra Ventricular Tachycardia  Rhythm:Regular     Neuro/Psych  Neuromuscular disease negative psych ROS   GI/Hepatic negative GI ROS, Neg liver ROS,   Endo/Other  negative endocrine ROS  Renal/GU negative Renal ROS     Musculoskeletal  (+) Arthritis ,   Abdominal   Peds  Hematology negative hematology ROS (+)   Anesthesia Other Findings   Reproductive/Obstetrics                            Anesthesia Physical Anesthesia Plan  ASA: II  Anesthesia Plan: MAC and Spinal   Post-op Pain Management:    Induction:   PONV Risk Score and Plan: 1 and Treatment may vary due to age or medical condition and Propofol infusion  Airway Management Planned: Nasal Cannula  Additional Equipment: None  Intra-op Plan:   Post-operative Plan:   Informed Consent: I have reviewed the patients History and Physical, chart, labs and discussed the procedure including the risks, benefits and alternatives for the proposed anesthesia with the patient or authorized representative who has indicated his/her understanding and acceptance.     Dental advisory given  Plan Discussed with: CRNA and Surgeon  Anesthesia Plan Comments: (Follows yearly with Dr. Marlou Porch for hx of PSVT. Previously Holter monitor showed frequent PACs up to 16,000.  Echocardiogram showed normal EF with mild mitral regurgitation.  Exercise treadmill test in October 2013 where he ran for 12 minutes was excellent but immediately after  stopping he had a brief episode of SVT with heart rate of 180 bpm. He was last seen 09/11/18. Per note " On atenolol.  Previously decreased to 25 mg a day because of bradycardia.  Seems to be doing quite well.  Heart rate can increase to 120 on treadmill." He was advised to f/u in 1 yr.  Preop labs reviewed, WNL.  EKG 03/26/19: Sinus bradycardia. Rate 49. Possible Left atrial enlargement. Incomplete right bundle branch block. No significant change since last tracing  Echo 2013: Conclusions: 1.  Normal LV size and function. 2.  Normal left ventricular ejection fraction estimated by 2D at 60 to 65%. 3.  There were no regional wall motion abnormalities. 4.  Borderline left atrial enlargement. 5.  Mildly thickened mitral valve. 6.  Trace mitral valve regurgitation. 7.  Mild tricuspid regurgitation.)      Anesthesia Quick Evaluation

## 2019-03-28 ENCOUNTER — Other Ambulatory Visit (HOSPITAL_COMMUNITY)
Admission: RE | Admit: 2019-03-28 | Discharge: 2019-03-28 | Disposition: A | Discharge status: Home / Self Care | Payer: Medicare HMO | Source: Ambulatory Visit | Attending: Orthopaedic Surgery | Admitting: Orthopaedic Surgery

## 2019-03-28 DIAGNOSIS — Z20828 Contact with and (suspected) exposure to other viral communicable diseases: Secondary | ICD-10-CM | POA: Diagnosis not present

## 2019-03-28 DIAGNOSIS — Z01812 Encounter for preprocedural laboratory examination: Secondary | ICD-10-CM | POA: Diagnosis not present

## 2019-03-29 LAB — NOVEL CORONAVIRUS, NAA (HOSP ORDER, SEND-OUT TO REF LAB; TAT 18-24 HRS): SARS-CoV-2, NAA: NOT DETECTED

## 2019-04-01 ENCOUNTER — Ambulatory Visit (HOSPITAL_COMMUNITY): Payer: Medicare HMO

## 2019-04-01 ENCOUNTER — Inpatient Hospital Stay (HOSPITAL_COMMUNITY)
Admission: AD | Admit: 2019-04-01 | Discharge: 2019-04-05 | DRG: 470 | Disposition: A | Discharge status: Home Health | Payer: Medicare HMO | Attending: Orthopaedic Surgery | Admitting: Orthopaedic Surgery

## 2019-04-01 ENCOUNTER — Ambulatory Visit (HOSPITAL_COMMUNITY): Payer: Medicare HMO | Admitting: Physician Assistant

## 2019-04-01 ENCOUNTER — Encounter (HOSPITAL_COMMUNITY): Payer: Self-pay

## 2019-04-01 ENCOUNTER — Observation Stay (HOSPITAL_COMMUNITY): Payer: Medicare HMO

## 2019-04-01 ENCOUNTER — Encounter (HOSPITAL_COMMUNITY)
Admission: AD | Disposition: A | Discharge status: Home Health | Payer: Self-pay | Source: Home / Self Care | Attending: Orthopaedic Surgery

## 2019-04-01 ENCOUNTER — Ambulatory Visit (HOSPITAL_COMMUNITY): Payer: Medicare HMO | Admitting: Certified Registered"

## 2019-04-01 ENCOUNTER — Other Ambulatory Visit: Payer: Self-pay

## 2019-04-01 DIAGNOSIS — Z419 Encounter for procedure for purposes other than remedying health state, unspecified: Secondary | ICD-10-CM

## 2019-04-01 DIAGNOSIS — Z96641 Presence of right artificial hip joint: Secondary | ICD-10-CM

## 2019-04-01 DIAGNOSIS — M1611 Unilateral primary osteoarthritis, right hip: Secondary | ICD-10-CM | POA: Diagnosis not present

## 2019-04-01 DIAGNOSIS — Z20828 Contact with and (suspected) exposure to other viral communicable diseases: Secondary | ICD-10-CM | POA: Diagnosis present

## 2019-04-01 DIAGNOSIS — K5790 Diverticulosis of intestine, part unspecified, without perforation or abscess without bleeding: Secondary | ICD-10-CM | POA: Diagnosis not present

## 2019-04-01 DIAGNOSIS — Z7982 Long term (current) use of aspirin: Secondary | ICD-10-CM

## 2019-04-01 DIAGNOSIS — Z85820 Personal history of malignant melanoma of skin: Secondary | ICD-10-CM | POA: Diagnosis not present

## 2019-04-01 DIAGNOSIS — Z806 Family history of leukemia: Secondary | ICD-10-CM | POA: Diagnosis not present

## 2019-04-01 DIAGNOSIS — Z471 Aftercare following joint replacement surgery: Secondary | ICD-10-CM | POA: Diagnosis not present

## 2019-04-01 DIAGNOSIS — I471 Supraventricular tachycardia: Secondary | ICD-10-CM | POA: Diagnosis not present

## 2019-04-01 DIAGNOSIS — Z9181 History of falling: Secondary | ICD-10-CM

## 2019-04-01 DIAGNOSIS — I2510 Atherosclerotic heart disease of native coronary artery without angina pectoris: Secondary | ICD-10-CM | POA: Diagnosis not present

## 2019-04-01 DIAGNOSIS — Z79899 Other long term (current) drug therapy: Secondary | ICD-10-CM

## 2019-04-01 DIAGNOSIS — Z8042 Family history of malignant neoplasm of prostate: Secondary | ICD-10-CM | POA: Diagnosis not present

## 2019-04-01 HISTORY — PX: TOTAL HIP ARTHROPLASTY: SHX124

## 2019-04-01 SURGERY — ARTHROPLASTY, HIP, TOTAL, ANTERIOR APPROACH
Anesthesia: Monitor Anesthesia Care | Site: Hip | Laterality: Right

## 2019-04-01 MED ORDER — METHOCARBAMOL 1000 MG/10ML IJ SOLN
500.0000 mg | Freq: Four times a day (QID) | INTRAVENOUS | Status: DC | PRN
  Filled 2019-04-01: qty 5

## 2019-04-01 MED ORDER — PHENOL 1.4 % MT LIQD: 1.0000 | OROMUCOSAL | Status: DC | PRN

## 2019-04-01 MED ORDER — CHLORHEXIDINE GLUCONATE 4 % EX LIQD: 60.0000 mL | Freq: Once | CUTANEOUS | Status: DC

## 2019-04-01 MED ORDER — EPHEDRINE 5 MG/ML INJ
INTRAVENOUS | Status: AC
  Filled 2019-04-01: qty 20

## 2019-04-01 MED ORDER — FENTANYL CITRATE (PF) 250 MCG/5ML IJ SOLN
INTRAMUSCULAR | Status: AC
  Filled 2019-04-01: qty 5

## 2019-04-01 MED ORDER — ACETAMINOPHEN 160 MG/5ML PO SOLN: 1000.0000 mg | Freq: Once | ORAL | Status: DC | PRN

## 2019-04-01 MED ORDER — ADULT MULTIVITAMIN W/MINERALS CH
1.0000 | ORAL_TABLET | Freq: Every day | ORAL | Status: DC
  Administered 2019-04-02 – 2019-04-05 (×4): 1 via ORAL
  Filled 2019-04-01 (×4): qty 1

## 2019-04-01 MED ORDER — OXYCODONE HCL 5 MG PO TABS: 10.0000 mg | ORAL_TABLET | ORAL | Status: DC | PRN

## 2019-04-01 MED ORDER — BIOTIN 5 MG PO CAPS: 5.0000 mg | ORAL_CAPSULE | Freq: Every day | ORAL | Status: DC

## 2019-04-01 MED ORDER — CEFAZOLIN SODIUM-DEXTROSE 1-4 GM/50ML-% IV SOLN
1.0000 g | Freq: Four times a day (QID) | INTRAVENOUS | Status: AC
  Administered 2019-04-01 – 2019-04-02 (×2): 1 g via INTRAVENOUS
  Filled 2019-04-01 (×2): qty 50

## 2019-04-01 MED ORDER — LACTATED RINGERS IV SOLN
INTRAVENOUS | Status: DC
  Administered 2019-04-01 (×2): via INTRAVENOUS

## 2019-04-01 MED ORDER — POLYETHYLENE GLYCOL 3350 17 G PO PACK: 17.0000 g | PACK | Freq: Every day | ORAL | Status: DC | PRN

## 2019-04-01 MED ORDER — ONDANSETRON HCL 4 MG/2ML IJ SOLN: 4.0000 mg | Freq: Four times a day (QID) | INTRAMUSCULAR | Status: DC | PRN

## 2019-04-01 MED ORDER — PANTOPRAZOLE SODIUM 40 MG PO TBEC
40.0000 mg | DELAYED_RELEASE_TABLET | Freq: Every day | ORAL | Status: DC
  Administered 2019-04-02 – 2019-04-05 (×4): 40 mg via ORAL
  Filled 2019-04-01 (×4): qty 1

## 2019-04-01 MED ORDER — 0.9 % SODIUM CHLORIDE (POUR BTL) OPTIME
TOPICAL | Status: DC | PRN
  Administered 2019-04-01: 16:00:00 1000 mL

## 2019-04-01 MED ORDER — ASPIRIN 81 MG PO CHEW
81.0000 mg | CHEWABLE_TABLET | Freq: Two times a day (BID) | ORAL | Status: DC
  Administered 2019-04-01 – 2019-04-05 (×8): 81 mg via ORAL
  Filled 2019-04-01 (×8): qty 1

## 2019-04-01 MED ORDER — TRANEXAMIC ACID-NACL 1000-0.7 MG/100ML-% IV SOLN
INTRAVENOUS | Status: AC
  Filled 2019-04-01: qty 100

## 2019-04-01 MED ORDER — SODIUM CHLORIDE 0.9 % IV SOLN
INTRAVENOUS | Status: DC
  Administered 2019-04-01: 21:00:00 via INTRAVENOUS

## 2019-04-01 MED ORDER — PROPOFOL 10 MG/ML IV BOLUS
INTRAVENOUS | Status: AC
  Filled 2019-04-01: qty 20

## 2019-04-01 MED ORDER — CEFAZOLIN SODIUM-DEXTROSE 2-4 GM/100ML-% IV SOLN
2.0000 g | INTRAVENOUS | Status: AC
  Administered 2019-04-01: 2 g via INTRAVENOUS

## 2019-04-01 MED ORDER — OXYCODONE HCL 5 MG PO TABS: 5.0000 mg | ORAL_TABLET | Freq: Once | ORAL | Status: DC | PRN

## 2019-04-01 MED ORDER — VITAMIN C 500 MG PO TABS
1000.0000 mg | ORAL_TABLET | Freq: Every day | ORAL | Status: DC
  Administered 2019-04-02 – 2019-04-05 (×4): 1000 mg via ORAL
  Filled 2019-04-01 (×4): qty 2

## 2019-04-01 MED ORDER — FENTANYL CITRATE (PF) 100 MCG/2ML IJ SOLN: 25.0000 ug | INTRAMUSCULAR | Status: DC | PRN

## 2019-04-01 MED ORDER — SODIUM CHLORIDE 0.9 % IR SOLN
Status: DC | PRN
  Administered 2019-04-01: 3000 mL

## 2019-04-01 MED ORDER — ALUM & MAG HYDROXIDE-SIMETH 200-200-20 MG/5ML PO SUSP: 30.0000 mL | ORAL | Status: DC | PRN

## 2019-04-01 MED ORDER — ATENOLOL 25 MG PO TABS
25.0000 mg | ORAL_TABLET | Freq: Every day | ORAL | Status: DC
  Administered 2019-04-02 – 2019-04-05 (×4): 25 mg via ORAL
  Filled 2019-04-01 (×4): qty 1

## 2019-04-01 MED ORDER — METOCLOPRAMIDE HCL 5 MG/ML IJ SOLN: 5.0000 mg | Freq: Three times a day (TID) | INTRAMUSCULAR | Status: DC | PRN

## 2019-04-01 MED ORDER — ONDANSETRON HCL 4 MG PO TABS: 4.0000 mg | ORAL_TABLET | Freq: Four times a day (QID) | ORAL | Status: DC | PRN

## 2019-04-01 MED ORDER — TRANEXAMIC ACID-NACL 1000-0.7 MG/100ML-% IV SOLN
1000.0000 mg | INTRAVENOUS | Status: AC
  Administered 2019-04-01: 1000 mg via INTRAVENOUS
  Filled 2019-04-01: qty 100

## 2019-04-01 MED ORDER — DOCUSATE SODIUM 100 MG PO CAPS
100.0000 mg | ORAL_CAPSULE | Freq: Two times a day (BID) | ORAL | Status: DC
  Administered 2019-04-01 – 2019-04-05 (×8): 100 mg via ORAL
  Filled 2019-04-01 (×8): qty 1

## 2019-04-01 MED ORDER — MAGNESIUM 250 MG PO TABS: 250.0000 mg | ORAL_TABLET | Freq: Every day | ORAL | Status: DC

## 2019-04-01 MED ORDER — EPHEDRINE SULFATE-NACL 50-0.9 MG/10ML-% IV SOSY
PREFILLED_SYRINGE | INTRAVENOUS | Status: DC | PRN
  Administered 2019-04-01: 15 mg via INTRAVENOUS
  Administered 2019-04-01: 10 mg via INTRAVENOUS
  Administered 2019-04-01: 15 mg via INTRAVENOUS

## 2019-04-01 MED ORDER — MENTHOL 3 MG MT LOZG: 1.0000 | LOZENGE | OROMUCOSAL | Status: DC | PRN

## 2019-04-01 MED ORDER — HYDROMORPHONE HCL 1 MG/ML IJ SOLN: 0.5000 mg | INTRAMUSCULAR | Status: DC | PRN

## 2019-04-01 MED ORDER — BUPIVACAINE IN DEXTROSE 0.75-8.25 % IT SOLN
INTRATHECAL | Status: DC | PRN
  Administered 2019-04-01: 1.8 mL via INTRATHECAL

## 2019-04-01 MED ORDER — ACETAMINOPHEN 10 MG/ML IV SOLN: 1000.0000 mg | Freq: Once | INTRAVENOUS | Status: DC | PRN

## 2019-04-01 MED ORDER — FENTANYL CITRATE (PF) 100 MCG/2ML IJ SOLN
INTRAMUSCULAR | Status: DC | PRN
  Administered 2019-04-01 (×2): 50 ug via INTRAVENOUS

## 2019-04-01 MED ORDER — OXYCODONE HCL 5 MG/5ML PO SOLN: 5.0000 mg | Freq: Once | ORAL | Status: DC | PRN

## 2019-04-01 MED ORDER — ACETAMINOPHEN 325 MG PO TABS: 325.0000 mg | ORAL_TABLET | Freq: Four times a day (QID) | ORAL | Status: DC | PRN

## 2019-04-01 MED ORDER — METOCLOPRAMIDE HCL 5 MG PO TABS: 5.0000 mg | ORAL_TABLET | Freq: Three times a day (TID) | ORAL | Status: DC | PRN

## 2019-04-01 MED ORDER — PROPOFOL 500 MG/50ML IV EMUL
INTRAVENOUS | Status: DC | PRN
  Administered 2019-04-01: 50 ug/kg/min via INTRAVENOUS

## 2019-04-01 MED ORDER — POVIDONE-IODINE 10 % EX SWAB: 2.0000 "application " | Freq: Once | CUTANEOUS | Status: DC

## 2019-04-01 MED ORDER — OXYCODONE HCL 5 MG PO TABS
5.0000 mg | ORAL_TABLET | ORAL | Status: DC | PRN
  Administered 2019-04-01: 5 mg via ORAL
  Administered 2019-04-02 (×3): 10 mg via ORAL
  Filled 2019-04-01 (×2): qty 1
  Filled 2019-04-01 (×3): qty 2

## 2019-04-01 MED ORDER — DIPHENHYDRAMINE HCL 12.5 MG/5ML PO ELIX: 12.5000 mg | ORAL_SOLUTION | ORAL | Status: DC | PRN

## 2019-04-01 MED ORDER — METHOCARBAMOL 500 MG PO TABS
500.0000 mg | ORAL_TABLET | Freq: Four times a day (QID) | ORAL | Status: DC | PRN
  Administered 2019-04-01 – 2019-04-04 (×2): 500 mg via ORAL
  Filled 2019-04-01 (×2): qty 1

## 2019-04-01 MED ORDER — MULTI-VITAMIN/MINERALS PO TABS: 1.0000 | ORAL_TABLET | Freq: Every day | ORAL | Status: DC

## 2019-04-01 MED ORDER — CEFAZOLIN SODIUM-DEXTROSE 2-4 GM/100ML-% IV SOLN
INTRAVENOUS | Status: AC
  Filled 2019-04-01: qty 100

## 2019-04-01 MED ORDER — ACETAMINOPHEN 500 MG PO TABS: 1000.0000 mg | ORAL_TABLET | Freq: Once | ORAL | Status: DC | PRN

## 2019-04-01 SURGICAL SUPPLY — 56 items
ARTICULEZE HEAD (Hips) ×2 IMPLANT
BENZOIN TINCTURE PRP APPL 2/3 (GAUZE/BANDAGES/DRESSINGS) ×2 IMPLANT
BLADE CLIPPER SURG (BLADE) IMPLANT
BLADE SAW SGTL 18X1.27X75 (BLADE) ×2 IMPLANT
COVER SURGICAL LIGHT HANDLE (MISCELLANEOUS) ×2 IMPLANT
COVER WAND RF STERILE (DRAPES) ×2 IMPLANT
CUP ACET PNNCL SECTR W/GRIP 56 (Hips) ×1 IMPLANT
DRAPE C-ARM 42X72 X-RAY (DRAPES) ×2 IMPLANT
DRAPE STERI IOBAN 125X83 (DRAPES) ×2 IMPLANT
DRAPE U-SHAPE 47X51 STRL (DRAPES) ×6 IMPLANT
DRSG AQUACEL AG ADV 3.5X10 (GAUZE/BANDAGES/DRESSINGS) ×2 IMPLANT
DRSG XEROFORM 1X8 (GAUZE/BANDAGES/DRESSINGS) ×2 IMPLANT
DURAPREP 26ML APPLICATOR (WOUND CARE) ×2 IMPLANT
ELECT BLADE 4.0 EZ CLEAN MEGAD (MISCELLANEOUS) ×2
ELECT BLADE 6.5 EXT (BLADE) IMPLANT
ELECT REM PT RETURN 9FT ADLT (ELECTROSURGICAL) ×2
ELECTRODE BLDE 4.0 EZ CLN MEGD (MISCELLANEOUS) ×1 IMPLANT
ELECTRODE REM PT RTRN 9FT ADLT (ELECTROSURGICAL) ×1 IMPLANT
FACESHIELD WRAPAROUND (MASK) ×4 IMPLANT
GLOVE BIOGEL PI IND STRL 8 (GLOVE) ×2 IMPLANT
GLOVE BIOGEL PI INDICATOR 8 (GLOVE) ×2
GLOVE ECLIPSE 8.0 STRL XLNG CF (GLOVE) ×2 IMPLANT
GLOVE ORTHO TXT STRL SZ7.5 (GLOVE) ×4 IMPLANT
GOWN STRL REUS W/ TWL LRG LVL3 (GOWN DISPOSABLE) ×2 IMPLANT
GOWN STRL REUS W/ TWL XL LVL3 (GOWN DISPOSABLE) ×2 IMPLANT
GOWN STRL REUS W/TWL LRG LVL3 (GOWN DISPOSABLE) ×2
GOWN STRL REUS W/TWL XL LVL3 (GOWN DISPOSABLE) ×2
HANDPIECE INTERPULSE COAX TIP (DISPOSABLE) ×1
HEAD ARTICULEZE (Hips) ×1 IMPLANT
KIT BASIN OR (CUSTOM PROCEDURE TRAY) ×2 IMPLANT
KIT TURNOVER KIT B (KITS) ×2 IMPLANT
LINER NEUTRAL 52MMX36MMX56N (Liner) ×2 IMPLANT
MANIFOLD NEPTUNE II (INSTRUMENTS) ×2 IMPLANT
NS IRRIG 1000ML POUR BTL (IV SOLUTION) ×2 IMPLANT
PACK TOTAL JOINT (CUSTOM PROCEDURE TRAY) ×2 IMPLANT
PAD ARMBOARD 7.5X6 YLW CONV (MISCELLANEOUS) ×2 IMPLANT
PINN SECTOR W/GRIP ACE CUP 56 (Hips) ×2 IMPLANT
SCREW 6.5MMX25MM (Screw) ×2 IMPLANT
SET HNDPC FAN SPRY TIP SCT (DISPOSABLE) ×1 IMPLANT
STAPLER VISISTAT 35W (STAPLE) IMPLANT
STEM FEMORAL SZ5 HIGH ACTIS (Stem) ×2 IMPLANT
STRIP CLOSURE SKIN 1/2X4 (GAUZE/BANDAGES/DRESSINGS) ×4 IMPLANT
SUT ETHIBOND NAB CT1 #1 30IN (SUTURE) ×2 IMPLANT
SUT MNCRL AB 4-0 PS2 18 (SUTURE) IMPLANT
SUT VIC AB 0 CT1 27 (SUTURE) ×1
SUT VIC AB 0 CT1 27XBRD ANBCTR (SUTURE) ×1 IMPLANT
SUT VIC AB 1 CT1 27 (SUTURE) ×1
SUT VIC AB 1 CT1 27XBRD ANBCTR (SUTURE) ×1 IMPLANT
SUT VIC AB 2-0 CT1 27 (SUTURE) ×1
SUT VIC AB 2-0 CT1 TAPERPNT 27 (SUTURE) ×1 IMPLANT
TOWEL GREEN STERILE (TOWEL DISPOSABLE) ×2 IMPLANT
TOWEL GREEN STERILE FF (TOWEL DISPOSABLE) ×2 IMPLANT
TRAY CATH 16FR W/PLASTIC CATH (SET/KITS/TRAYS/PACK) IMPLANT
TRAY FOLEY W/BAG SLVR 16FR (SET/KITS/TRAYS/PACK)
TRAY FOLEY W/BAG SLVR 16FR ST (SET/KITS/TRAYS/PACK) IMPLANT
WATER STERILE IRR 1000ML POUR (IV SOLUTION) ×4 IMPLANT

## 2019-04-01 NOTE — H&P (Signed)
TOTAL HIP ADMISSION H&P  Patient is admitted for right total hip arthroplasty.  Subjective:  Chief Complaint: right hip pain  HPI: Jack Huerta, 73 y.o. male, has a history of pain and functional disability in the right hip(s) due to arthritis and patient has failed non-surgical conservative treatments for greater than 12 weeks to include NSAID's and/or analgesics, corticosteriod injections, flexibility and strengthening excercises, use of assistive devices, weight reduction as appropriate and activity modification.  Onset of symptoms was gradual starting 2 years ago with gradually worsening course since that time.The patient noted no past surgery on the right hip(s).  Patient currently rates pain in the right hip at 10 out of 10 with activity. Patient has night pain, worsening of pain with activity and weight bearing, trendelenberg gait, pain that interfers with activities of daily living and pain with passive range of motion. Patient has evidence of subchondral cysts, subchondral sclerosis, periarticular osteophytes and joint space narrowing by imaging studies. This condition presents safety issues increasing the risk of falls.  There is no current active infection.  Patient Active Problem List   Diagnosis Date Noted  . Unilateral primary osteoarthritis, right hip 02/18/2019  . Right sided sciatica 10/22/2018  . ASCVD (arteriosclerotic cardiovascular disease) > 10 06/05/2018  . Pain in joint of right hip 07/26/2017  . PSVT (paroxysmal supraventricular tachycardia) ([place]) 07/24/2014  . Diverticulosis of colon (without mention of hemorrhage) 08/07/2011   Past Medical History:  Diagnosis Date  . Cancer (HCC)    Melanoma (spots removed)  . Chicken pox   . False positive serological test for hepatitis C   . [place] measles   . Hemorrhoids   . Mumps   . SVT (supraventricular tachycardia) ([place]), exercise induced 1993  . Tinnitus     Past Surgical History:  Procedure Laterality Date  .  APPENDECTOMY  1958  . INGUINAL HERNIA REPAIR Left 04/2010  . INGUINAL HERNIA REPAIR Right 04/2001  . INGUINAL HERNIA REPAIR Left 1973  . TONSILLECTOMY  1962    No current facility-administered medications for this encounter.    Current Outpatient Medications  Medication Sig Dispense Refill Last Dose  . Ascorbic Acid (VITAMIN C) 1000 MG tablet Take 1,000 mg by mouth daily.     Marland Kitchen aspirin 81 MG tablet Take 81 mg by mouth daily.     Marland Kitchen atenolol (TENORMIN) 25 MG tablet TAKE 1 TABLET BY MOUTH ONCE A DAY 90 tablet 2   . Biotin 5 MG CAPS Take 5 mg by mouth daily.     . Magnesium 250 MG TABS Take 250 mg by mouth daily.     . Multiple Vitamins-Minerals (MULTIVITAMIN WITH MINERALS) tablet Take 1 tablet by mouth daily.      No Known Allergies  Social History   Tobacco Use  . Smoking status: Never Smoker  . Smokeless tobacco: Never Used  Substance Use Topics  . Alcohol use: No    Family History  Problem Relation Age of Onset  . Prostate cancer Father   . Aneurysm Father   . Arthritis/Rheumatoid Mother   . Leukemia Sister   . Chronic fatigue Brother   . Colon cancer Neg Hx      Review of Systems  Musculoskeletal: Positive for joint pain.  All other systems reviewed and are negative.   Objective:  Physical Exam  Constitutional: He is oriented to person, place, and time. He appears well-developed and well-nourished.  HENT:  Head: Normocephalic and atraumatic.  Eyes: Pupils are equal, round, and  reactive to light. EOM are normal.  Neck: Normal range of motion. Neck supple.  Cardiovascular: Normal rate.  Respiratory: Effort normal.  GI: Soft.  Musculoskeletal:     Right hip: He exhibits decreased range of motion, decreased strength, tenderness and bony tenderness.  Neurological: He is alert and oriented to person, place, and time.  Skin: Skin is warm and dry.  Psychiatric: He has a normal mood and affect.    Vital signs in last 24 hours:    Labs:   Estimated body mass  index is 20.9 kg/m as calculated from the following:   Height as of 03/26/19: 5\' 9"  (1.753 m).   Weight as of 03/26/19: 64.2 kg.   Imaging Review Plain radiographs demonstrate severe degenerative joint disease of the right hip(s). The bone quality appears to be good for age and reported activity level.      Assessment/Plan:  End stage arthritis, right hip(s)  The patient history, physical examination, clinical judgement of the provider and imaging studies are consistent with end stage degenerative joint disease of the right hip(s) and total hip arthroplasty is deemed medically necessary. The treatment options including medical management, injection therapy, arthroscopy and arthroplasty were discussed at length. The risks and benefits of total hip arthroplasty were presented and reviewed. The risks due to aseptic loosening, infection, stiffness, dislocation/subluxation,  thromboembolic complications and other imponderables were discussed.  The patient acknowledged the explanation, agreed to proceed with the plan and consent was signed. Patient is being admitted for inpatient treatment for surgery, pain control, PT, OT, prophylactic antibiotics, VTE prophylaxis, progressive ambulation and ADL's and discharge planning.The patient is planning to be discharged home with home health services

## 2019-04-01 NOTE — Op Note (Signed)
NAME: Jack Huerta, CICCHINI MEDICAL RECORD [id number] ACCOUNT [id number] DATE OF BIRTH:November 27, 1945 FACILITY: [facility] LOCATION: [facility] PHYSICIAN:Kinisha Soper [place], MD  OPERATIVE REPORT  DATE OF PROCEDURE:  04/01/2019  PREOPERATIVE DIAGNOSIS:  Primary osteoarthritis and degenerative joint disease, right hip.  POSTOPERATIVE DIAGNOSIS:  Primary osteoarthritis and degenerative joint disease, right hip.  PROCEDURE:  Right total hip arthroplasty through direct anterior approach.  IMPLANTS:  DePuy Sector Gription acetabular component size 56, a single screw, size 36+0 neutral polyethylene liner, size 5 ACTIS femoral component with high offset, size 36+5 metal hip ball.  SURGEON:  Jonn Shingles, MD  ASSISTANT:  Erskine Emery, PA-C.  ANESTHESIA:  Spinal.  ANTIBIOTICS:  Two g IV Ancef.  ESTIMATED BLOOD LOSS:  250-300 mL.  COMPLICATIONS:  None.  INDICATIONS:  The patient is a very active 73 year old gentleman with debilitating arthritis involving his right hip.  His x-rays show complete loss of joint space.  He has a leg length discrepancy due to his arthritis with his right leg shorter than the  left.  His pain has become daily and is detrimentally affecting his mobility, his quality of life and his activities of daily living.  He has tried and failed conservative treatment, including intraarticular injections.  At this point, given his daily  pain and the detrimental effect this is having on his mobility and his quality of life, as well as activities of daily living, he does wish to proceed with a total hip arthroplasty.  We talked about the risk of acute blood loss anemia, nerve or vessel  injury, fracture, infection, dislocation, DVT and implant failure.  We talked about the goals being decreased pain, improve mobility and overall improvement of quality of life.  DESCRIPTION OF PROCEDURE:  After informed consent was obtained and appropriate right hip was marked, he was  brought to the operating room and sat up on a stretcher where spinal anesthesia was then obtained.  We laid him in the supine position on a  stretcher and verified that his leg lengths were different with the right operative side shorter than the left.  We then had a Foley catheter placed and placed traction boots on both his feet.  Next, I placed him supine on the Hana fracture table with  the perineal post in place and both legs in in-line skeletal traction device and no traction applied.  His right operative hip was prepped and draped with DuraPrep and sterile drapes.  A time-out was called.  He was identified as correct patient, correct  right hip.  We then made an incision just inferior and posterior to the anterior superior iliac spine and carried this obliquely down the leg.  We dissected down to tensor fascia lata muscle.  Tensor fascia was then divided longitudinally to proceed  with direct anterior approach to the hip.  We identified and cauterized circumflex vessels and identified the hip capsule, opened up the hip capsule in an L-type format, finding a very large joint effusion consistent with his arthritis, but there was  definitely significant effusion with synovitis.  We placed curved retractors within the joint capsule around the medial and lateral femoral neck and made our femoral neck cut with an oscillating saw and completed this with an osteotome.  We placed a  corkscrew guide in the femoral head and removed the femoral head in its entirety and found it to be completely devoid of cartilage.  We then placed a bent Hohmann over the medial acetabular rim and removed remnants of the  acetabular labrum and other  debris and then began reaming under direct visualization from a size 42 reamer and going in stepwise increments up to a size 55 with all reamers under direct visualization, the last reamer under direct fluoroscopy, so we could obtain our depth of  reaming, our inclination and  anteversion.  I then placed the real DePuy Sector Gription acetabular component size 56 and a single screw and we placed a 36+0 neutral polyethylene liner for that size acetabular component.  Attention was then turned to the  femur.  With the leg externally rotated to 120 degrees, extended and adducted, we were able to place a Mueller retractor medially and Hohman retractor behind the greater trochanter.  We released the lateral joint capsule and used a box-cutting osteotome  to enter the femoral canal and a rongeur to lateralize.  We then began broaching using the ACTIS broaching system from a size zero, going all the way up to a size 5.  With the 5 in place, we trialed a high offset femoral neck and a 36+____ hip ball,  reduced this in the acetabulum and it was very stable.  We felt like we needed just a little bit more leg length.   We dislocated the hip and removed the trial components.  We placed the real high offset ACTIS femoral component size 5 from DePuy,  followed by the real 36+5 metal hip ball, again reduced this in the acetabulum and we were pleased with the leg length, offset, range of motion and stability assessed clinically and radiographically.  We then irrigated the soft tissue with normal saline  solution using pulsatile lavage.  I was able to close the joint capsule with interrupted #1 Ethibond suture.  We closed the tensor fascia with a running #1 Vicryl suture,  0 Vicryl was used to close the deep tissue, 2-0 Vicryl was used to close  subcutaneous tissue and we had interrupted staples  placed on the skin incision.  Xeroform and Aquacel dressing was applied.  He was taken off of the fracture table and taken to the recovery room in stable condition.  All final counts were correct.   There were no complications noted.  Of note Benita Stabile, PA-C, assisted during the entire case.  His assistance was crucial for facilitating all aspects of this case.  VN/NUANCE  D:04/01/2019 T:04/01/2019  JOB:[id number]

## 2019-04-01 NOTE — Brief Op Note (Signed)
04/01/2019  4:12 PM  PATIENT:  Nelda Severe  73 y.o. male  PRE-OPERATIVE DIAGNOSIS:  osteoarthritis right hip  POST-OPERATIVE DIAGNOSIS:  osteoarthritis right hip  PROCEDURE:  Procedure(s): RIGHT TOTAL HIP ARTHROPLASTY ANTERIOR APPROACH (Right)  SURGEON:  Surgeon(s) and Role:    Mcarthur Rossetti, MD - Primary  PHYSICIAN ASSISTANT: Benita Stabile, PA-C  ANESTHESIA:   spinal  EBL:  250 mL   COUNTS:  YES  DICTATION: .Other Dictation: Dictation Number [id number]  PLAN OF CARE: Admit to inpatient   PATIENT DISPOSITION:  PACU - hemodynamically stable.   Delay start of Pharmacological VTE agent (>24hrs) due to surgical blood loss or risk of bleeding: no

## 2019-04-01 NOTE — Anesthesia Procedure Notes (Signed)
Spinal  Patient location during procedure: OR Start time: 04/01/2019 3:09 PM End time: 04/01/2019 3:18 PM Staffing Anesthesiologist: Oleta Mouse, MD Performed: anesthesiologist  Preanesthetic Checklist Completed: patient identified, surgical consent, pre-op evaluation, timeout performed, IV checked, risks and benefits discussed and monitors and equipment checked Spinal Block Patient position: sitting Prep: DuraPrep Patient monitoring: heart rate, cardiac monitor, continuous pulse ox and blood pressure Approach: midline Location: L5-S1 Injection technique: single-shot Needle Needle type: Pencan  Needle gauge: 24 G Needle length: 9 cm Assessment Sensory level: T8

## 2019-04-01 NOTE — Transfer of Care (Signed)
Immediate Anesthesia Transfer of Care Note  Patient: Jack Huerta  Procedure(s) Performed: RIGHT TOTAL HIP ARTHROPLASTY ANTERIOR APPROACH (Right Hip)  Patient Location: PACU  Anesthesia Type:Spinal and MAC combined with regional for post-op pain  Level of Consciousness: drowsy and patient cooperative  Airway & Oxygen Therapy: Patient Spontanous Breathing  Post-op Assessment: Report given to RN and Post -op Vital signs reviewed and stable  Post vital signs: Reviewed and stable  Last Vitals:  Vitals Value Taken Time  BP 104/65 04/01/19 1620  Temp    Pulse 62 04/01/19 1621  Resp 10 04/01/19 1621  SpO2 100 % 04/01/19 1621  Vitals shown include unvalidated device data.  Last Pain:  Vitals:   04/01/19 1308  TempSrc:   PainSc: 0-No pain         Complications: No apparent anesthesia complications

## 2019-04-02 ENCOUNTER — Ambulatory Visit: Payer: Medicare HMO | Admitting: Orthopaedic Surgery

## 2019-04-02 ENCOUNTER — Encounter (HOSPITAL_COMMUNITY): Payer: Self-pay | Admitting: Orthopaedic Surgery

## 2019-04-02 DIAGNOSIS — Z79899 Other long term (current) drug therapy: Secondary | ICD-10-CM | POA: Diagnosis not present

## 2019-04-02 DIAGNOSIS — Z8042 Family history of malignant neoplasm of prostate: Secondary | ICD-10-CM | POA: Diagnosis not present

## 2019-04-02 DIAGNOSIS — I2510 Atherosclerotic heart disease of native coronary artery without angina pectoris: Secondary | ICD-10-CM | POA: Diagnosis not present

## 2019-04-02 DIAGNOSIS — Z7982 Long term (current) use of aspirin: Secondary | ICD-10-CM | POA: Diagnosis not present

## 2019-04-02 DIAGNOSIS — Z9181 History of falling: Secondary | ICD-10-CM | POA: Diagnosis not present

## 2019-04-02 DIAGNOSIS — M1611 Unilateral primary osteoarthritis, right hip: Secondary | ICD-10-CM | POA: Diagnosis not present

## 2019-04-02 DIAGNOSIS — Z85820 Personal history of malignant melanoma of skin: Secondary | ICD-10-CM | POA: Diagnosis not present

## 2019-04-02 DIAGNOSIS — Z806 Family history of leukemia: Secondary | ICD-10-CM | POA: Diagnosis not present

## 2019-04-02 DIAGNOSIS — Z20828 Contact with and (suspected) exposure to other viral communicable diseases: Secondary | ICD-10-CM | POA: Diagnosis not present

## 2019-04-02 LAB — SARS CORONAVIRUS 2 (TAT 6-24 HRS): SARS Coronavirus 2: NEGATIVE

## 2019-04-02 LAB — CBC
HCT: 38 % — ABNORMAL LOW (ref 39.0–52.0)
Hemoglobin: 12.9 g/dL — ABNORMAL LOW (ref 13.0–17.0)
MCH: 32.2 pg (ref 26.0–34.0)
MCHC: 33.9 g/dL (ref 30.0–36.0)
MCV: 94.8 fL (ref 80.0–100.0)
Platelets: 159 10*3/uL (ref 150–400)
RBC: 4.01 MIL/uL — ABNORMAL LOW (ref 4.22–5.81)
RDW: 13.2 % (ref 11.5–15.5)
WBC: 7.2 10*3/uL (ref 4.0–10.5)
nRBC: 0 % (ref 0.0–0.2)

## 2019-04-02 LAB — BASIC METABOLIC PANEL
Anion gap: 8 (ref 5–15)
BUN: 13 mg/dL (ref 8–23)
CO2: 27 mmol/L (ref 22–32)
Calcium: 8.2 mg/dL — ABNORMAL LOW (ref 8.9–10.3)
Chloride: 99 mmol/L (ref 98–111)
Creatinine, Ser: 0.84 mg/dL (ref 0.61–1.24)
GFR calc Af Amer: >60 mL/min (ref >60)
GFR calc non Af Amer: >60 mL/min (ref >60)
Glucose, Bld: 116 mg/dL — ABNORMAL HIGH (ref 70–99)
Potassium: 3.6 mmol/L (ref 3.5–5.1)
Sodium: 134 mmol/L — ABNORMAL LOW (ref 135–145)

## 2019-04-02 MED ORDER — KETOROLAC TROMETHAMINE 15 MG/ML IJ SOLN
7.5000 mg | Freq: Four times a day (QID) | INTRAMUSCULAR | Status: AC
  Administered 2019-04-02 – 2019-04-04 (×9): 7.5 mg via INTRAVENOUS
  Filled 2019-04-02 (×10): qty 1

## 2019-04-02 MED ORDER — TAMSULOSIN HCL 0.4 MG PO CAPS
0.4000 mg | ORAL_CAPSULE | Freq: Every day | ORAL | Status: AC
  Administered 2019-04-02: 0.4 mg via ORAL
  Filled 2019-04-02: qty 1

## 2019-04-02 MED ORDER — ZOLPIDEM TARTRATE 5 MG PO TABS
5.0000 mg | ORAL_TABLET | Freq: Once | ORAL | Status: AC
  Administered 2019-04-02: 5 mg via ORAL
  Filled 2019-04-02: qty 1

## 2019-04-02 NOTE — Evaluation (Signed)
Physical Therapy Evaluation Patient Details Name: Jack Huerta MRN: [id number] DOB: 19-Oct-1945 Today's Date: 04/02/2019   History of Present Illness  73 y/o male s/p R THA with direct anterior approach (04/01/19)  Clinical Impression  Pt admitted with above diagnosis. Pt currently with functional limitations due to the deficits listed below (see PT Problem List). Pt will benefit from skilled PT to increase their independence and safety with mobility to allow discharge to the venue listed below.  Pt hypotensive with gait and only able to ambulate 7' at eval.  Moving at [facility] A level overall. Recommend SNF for short term rehab.     Follow Up Recommendations SNF    Equipment Recommendations  None recommended by PT    Recommendations for Other Services       Precautions / Restrictions Precautions Precautions: Fall Restrictions Weight Bearing Restrictions: Yes RLE Weight Bearing: Weight bearing as tolerated      Mobility  Bed Mobility Overal bed mobility: Needs Assistance Bed Mobility: Supine to Sit     Supine to sit: Min assist;HOB elevated     General bed mobility comments: MIN A for R LE to get up to EOB  Transfers Overall transfer level: Needs assistance Equipment used: Rolling walker (2 wheeled) Transfers: Sit to/from Stand Sit to Stand: Min assist;From elevated surface         General transfer comment: Cues for hand placement  Ambulation/Gait Ambulation/Gait assistance: Min assist;+2 safety/equipment Gait Distance (Feet): 7 Feet Assistive device: Rolling walker (2 wheeled) Gait Pattern/deviations: Step-to pattern;Decreased step length - right;Decreased step length - left;Decreased stance time - right Gait velocity: decreased   General Gait Details: Pt needing frequent cueing for proper sequencing of gait.  Pt felt a little nauseous and lightheaded. recliner brought  to pt and BP taken in sitting 83/49 HR 68.  After reclining and sitting for several  minutes BP 109/52 HR 55  Stairs            Wheelchair Mobility    Modified Rankin (Stroke Patients Only)       Balance Overall balance assessment: Needs assistance   Sitting balance-Leahy Scale: Good     Standing balance support: Bilateral upper extremity supported Standing balance-Leahy Scale: Poor Standing balance comment: requires UE support                             Pertinent Vitals/Pain Pain Assessment: Faces Faces Pain Scale: Hurts a little bit Pain Location: R hip Pain Descriptors / Indicators: Grimacing Pain Intervention(s): Limited activity within patient's tolerance;Monitored during session;Premedicated before session    Home Living Family/patient expects to be discharged to:: Skilled nursing facility                      Prior Function Level of Independence: Independent         Comments: active, wen to the gym     Hand Dominance        Extremity/Trunk Assessment   Upper Extremity Assessment Upper Extremity Assessment: Defer to OT evaluation    Lower Extremity Assessment Lower Extremity Assessment: RLE deficits/detail RLE Deficits / Details: Limited with guarded movement RLE: Unable to fully assess due to pain       Communication   Communication: No difficulties  Cognition Arousal/Alertness: Awake/alert Behavior During Therapy: WFL for tasks assessed/performed Overall Cognitive Status: Within Functional Limits for tasks assessed  General Comments      Exercises     Assessment/Plan    PT Assessment Patient needs continued PT services  PT Problem List Decreased strength;Decreased range of motion;Decreased activity tolerance;Decreased balance;Decreased mobility;Decreased knowledge of use of DME       PT Treatment Interventions DME instruction;Gait training;Functional mobility training;Therapeutic activities;Therapeutic exercise;Balance  training;Neuromuscular re-education;Patient/family education    PT Goals (Current goals can be found in the Care Plan section)  Acute Rehab PT Goals Patient Stated Goal: go to short term rehab PT Goal Formulation: With patient Time For Goal Achievement: 04/09/19 Potential to Achieve Goals: Good    Frequency 7X/week   Barriers to discharge        Co-evaluation PT/OT/SLP Co-Evaluation/Treatment: Yes Reason for Co-Treatment: For patient/therapist safety PT goals addressed during session: Mobility/safety with mobility         AM-PAC PT "6 Clicks" Mobility  Outcome Measure Help needed turning from your back to your side while in a flat bed without using bedrails?: A Little Help needed moving from lying on your back to sitting on the side of a flat bed without using bedrails?: A Little Help needed moving to and from a bed to a chair (including a wheelchair)?: A Little Help needed standing up from a chair using your arms (e.g., wheelchair or bedside chair)?: A Little Help needed to walk in hospital room?: A Little Help needed climbing 3-5 steps with a railing? : A Lot 6 Click Score: 17    End of Session Equipment Utilized During Treatment: Gait belt Activity Tolerance: Treatment limited secondary to medical complications (Comment)(hypotension) Patient left: in chair;with call bell/phone within reach Nurse Communication: Mobility status PT Visit Diagnosis: Unsteadiness on feet (R26.81);Difficulty in walking, not elsewhere classified (R26.2);Pain Pain - Right/Left: Right Pain - part of body: Hip    Time: [id number] PT Time Calculation (min) (ACUTE ONLY): 27 min   Charges:   PT Evaluation $PT Eval Moderate Complexity: 1 Mod          Aly Seidenberg L. Tamala Julian, [place] Pager [phone] 04/02/2019   Galen Manila 04/02/2019, 1:16 PM

## 2019-04-02 NOTE — Progress Notes (Signed)
Subjective: 1 Day Post-Op Procedure(s) (LRB): RIGHT TOTAL HIP ARTHROPLASTY ANTERIOR APPROACH (Right) Patient reports pain as moderate.    Objective: Vital signs in last 24 hours: Temp:  [97 F (36.1 C)-98.2 F (36.8 C)] 98.2 F (36.8 C) (11/11 0000) Pulse Rate:  [60-79] 79 (11/11 0522) Resp:  [12-18] 16 (11/11 0522) BP: (104-129)/(56-78) 104/56 (11/11 0522) SpO2:  [92 %-100 %] 99 % (11/11 0522) Weight:  [65.3 kg] 65.3 kg (11/10 1233)  Intake/Output from previous day: 11/10 0701 - 11/11 0700 In: 1552.3 [I.V.:1402.3; IV Piggyback:100] Out: 2150 [Urine:1900; Blood:250] Intake/Output this shift: No intake/output data recorded.  Recent Labs    04/02/19 0424  HGB 12.9*   Recent Labs    04/02/19 0424  WBC 7.2  RBC 4.01*  HCT 38.0*  PLT 159   Recent Labs    04/02/19 0424  NA 134*  K 3.6  CL 99  CO2 27  BUN 13  CREATININE 0.84  GLUCOSE 116*  CALCIUM 8.2*   No results for input(s): LABPT, INR in the last 72 hours.  Sensation intact distally Intact pulses distally Dorsiflexion/Plantar flexion intact Incision: dressing C/D/I   Assessment/Plan: 1 Day Post-Op Procedure(s) (LRB): RIGHT TOTAL HIP ARTHROPLASTY ANTERIOR APPROACH (Right) Up with therapy Discharge to SNF next 1-2 days Social Work consulted - may already have arrangement with [facility] Obviously will stay one more night minimum due to SNF placement - Case Manager has my permission to switch to Inpatient Admit if required.    Patient's anticipated LOS is less than 2 midnights, meeting these requirements: - Younger than 61 - Lives within 1 hour of care - Has a competent adult at home to recover with post-op recover - NO history of  - Chronic pain requiring opiods  - Diabetes  - Coronary Artery Disease  - Heart failure  - Heart attack  - Stroke  - DVT/VTE  - Cardiac arrhythmia  - Respiratory Failure/COPD  - Renal failure  - Anemia  - Advanced Liver disease       Mcarthur Rossetti 04/02/2019, 7:09 AM

## 2019-04-02 NOTE — Discharge Instructions (Signed)

## 2019-04-02 NOTE — Progress Notes (Signed)
PT Cancellation Note  Patient Details Name: Jack Huerta MRN: [id number] DOB: 1945-11-18   Cancelled Treatment:    Reason Eval/Treat Not Completed: Pain limiting ability to participate. Pt politely requesting to hold PT at this time and check back after lunch and after pain meds have kicked in. Will check back as schedule permits.   Galen Manila 04/02/2019, 11:07 AM

## 2019-04-02 NOTE — Evaluation (Signed)
Occupational Therapy Evaluation Patient Details Name: Jack Huerta MRN: [id number] DOB: 1946-04-03 Today's Date: 04/02/2019    History of Present Illness 73 y/o male s/p R THA with direct anterior approach (04/01/19)   Clinical Impression   Pt was admitted for the above sx.  At baseline, he is independent and was very active, including working out at [employer].  Pt was limited by BP.  Earlier he was having pain issues. He will benefit from continued OT to increase independence with adls and bathroom transfers. Goals in acute setting are for supervision level with AE as needed     Follow Up Recommendations  SNF    Equipment Recommendations  (defer to next venue)    Recommendations for Other Services       Precautions / Restrictions Precautions Precautions: Fall Restrictions Weight Bearing Restrictions: Yes RLE Weight Bearing: Weight bearing as tolerated      Mobility Bed Mobility Overal bed mobility: Needs Assistance Bed Mobility: Supine to Sit     Supine to sit: Min assist;HOB elevated     General bed mobility comments: MIN A for R LE to get up to EOB  Transfers Overall transfer level: Needs assistance Equipment used: Rolling walker (2 wheeled) Transfers: Sit to/from Stand Sit to Stand: Min assist;From elevated surface         General transfer comment: Cues for hand placement    Balance Overall balance assessment: Needs assistance   Sitting balance-Leahy Scale: Good     Standing balance support: Bilateral upper extremity supported Standing balance-Leahy Scale: Poor Standing balance comment: requires UE support                           ADL either performed or assessed with clinical judgement   ADL Overall ADL's : Needs assistance/impaired Eating/Feeding: Independent   Grooming: Set up   Upper Body Bathing: Set up   Lower Body Bathing: Moderate assistance   Upper Body Dressing : Minimal assistance(lines)   Lower Body Dressing:  Maximal assistance   Toilet Transfer: Minimal assistance;+2 for safety/equipment;Ambulation;RW(chair)   Toileting- Clothing Manipulation and Hygiene: Minimal assistance;Sit to/from stand         General ADL Comments: Pt became nauseous and dizzy after taking a few steps:  transferred to recliner and BP was low (see vitals section of chart).  Pt would benefit from AE education     Vision         Perception     Praxis      Pertinent Vitals/Pain Pain Assessment: Faces Faces Pain Scale: Hurts a little bit Pain Location: R hip Pain Descriptors / Indicators: Grimacing Pain Intervention(s): Limited activity within patient's tolerance;Monitored during session;Premedicated before session;Repositioned     Hand Dominance     Extremity/Trunk Assessment Upper Extremity Assessment Upper Extremity Assessment: Overall WFL for tasks assessed          Communication Communication Communication: No difficulties   Cognition Arousal/Alertness: Awake/alert Behavior During Therapy: WFL for tasks assessed/performed Overall Cognitive Status: Within Functional Limits for tasks assessed                                 General Comments: Pt difficult to follow at times--asked many questions, but vague:  plan, who is directing it, etc   General Comments       Exercises     Shoulder Instructions      Home  Living Family/patient expects to be discharged to:: Skilled nursing facility                                        Prior Functioning/Environment Level of Independence: Independent        Comments: active, wen to the gym        OT Problem List: Decreased strength;Decreased activity tolerance;Pain;Decreased knowledge of use of DME or AE;Cardiopulmonary status limiting activity      OT Treatment/Interventions: Self-care/ADL training;DME and/or AE instruction;Patient/family education;Therapeutic activities    OT Goals(Current goals can be found  in the care plan section) Acute Rehab OT Goals Patient Stated Goal: go to short term rehab OT Goal Formulation: With patient Time For Goal Achievement: 04/09/19 Potential to Achieve Goals: Good ADL Goals Pt Will Perform Lower Body Bathing: with supervision;with adaptive equipment;sit to/from stand Pt Will Perform Lower Body Dressing: with supervision;sit to/from stand;with adaptive equipment Pt Will Transfer to Toilet: with supervision;ambulating;bedside commode Pt Will Perform Toileting - Clothing Manipulation and hygiene: with supervision;sit to/from stand  OT Frequency: Min 2X/week   Barriers to D/C:            Co-evaluation PT/OT/SLP Co-Evaluation/Treatment: Yes Reason for Co-Treatment: For patient/therapist safety PT goals addressed during session: Mobility/safety with mobility OT goals addressed during session: ADL's and self-care      AM-PAC OT "6 Clicks" Daily Activity     Outcome Measure Help from another person eating meals?: None Help from another person taking care of personal grooming?: A Little Help from another person toileting, which includes using toliet, bedpan, or urinal?: A Little Help from another person bathing (including washing, rinsing, drying)?: A Lot Help from another person to put on and taking off regular upper body clothing?: A Little Help from another person to put on and taking off regular lower body clothing?: A Lot 6 Click Score: 17   End of Session Nurse Communication: Mobility status(BP)  Activity Tolerance: Treatment limited secondary to medical complications (Comment)(BP) Patient left: in chair;with call bell/phone within reach  OT Visit Diagnosis: Unsteadiness on feet (R26.81);Muscle weakness (generalized) (M62.81);Pain Pain - Right/Left: Right Pain - part of body: Hip                Time: [id number] OT Time Calculation (min): 27 min Charges:  OT General Charges $OT Visit: 1 Visit OT Evaluation $OT Eval Low Complexity: [place], OTR/L Acute Rehabilitation Services [phone] [facility] pager [phone] office 04/02/2019  [place] 04/02/2019, 1:33 PM

## 2019-04-02 NOTE — Progress Notes (Signed)
OT Cancellation Note  Patient Details Name: Jack Huerta MRN: [id number] DOB: 02/28/1946   Cancelled Treatment:    Reason Eval/Treat Not Completed: Pain limiting ability to participate. Have attempted OT eval twice this am. Pt states pain is still not controlled.  PT/OT will return together after lunch.  Shae Augello 04/02/2019, 11:07 AM  Lesle Chris, OTR/L Acute Rehabilitation Services [phone] [facility] pager [phone] office 04/02/2019

## 2019-04-03 ENCOUNTER — Encounter (HOSPITAL_COMMUNITY): Payer: Self-pay | Admitting: Orthopaedic Surgery

## 2019-04-03 MED ORDER — OXYCODONE HCL 5 MG PO TABS: 5.0000 mg | ORAL_TABLET | ORAL | 0 refills | Status: DC | PRN

## 2019-04-03 MED ORDER — ASPIRIN 81 MG PO CHEW: 81.0000 mg | CHEWABLE_TABLET | Freq: Two times a day (BID) | ORAL | 0 refills | Status: DC

## 2019-04-03 MED ORDER — METHOCARBAMOL 500 MG PO TABS: 500.0000 mg | ORAL_TABLET | Freq: Four times a day (QID) | ORAL | 0 refills | Status: DC | PRN

## 2019-04-03 MED ORDER — ZOLPIDEM TARTRATE 5 MG PO TABS
5.0000 mg | ORAL_TABLET | Freq: Once | ORAL | Status: AC
  Administered 2019-04-03: 5 mg via ORAL
  Filled 2019-04-03: qty 1

## 2019-04-03 NOTE — Progress Notes (Signed)
Patient ID: Jack Huerta, male   DOB: 03/28/1946, 73 y.o.   MRN: [id number] Looks good overall.  Pain better controlled today.  His right hip is stable.  I did change his dressing and his incision looks good overall.  Can be discharged to [name] tomorrow

## 2019-04-03 NOTE — Anesthesia Postprocedure Evaluation (Signed)
Anesthesia Post Note  Patient: Jack Huerta  Procedure(s) Performed: RIGHT TOTAL HIP ARTHROPLASTY ANTERIOR APPROACH (Right Hip)     Anesthesia Post Evaluation  Last Vitals:  Vitals:   04/03/19 0805 04/03/19 1258  BP: (!) 99/56 (!) 110/56  Pulse: 67 62  Resp: 18 18  Temp: 36.7 C 36.7 C  SpO2: 98% 100%    Last Pain:  Vitals:   04/03/19 1258  TempSrc: Oral  PainSc:                  Ayriana Wix

## 2019-04-03 NOTE — Progress Notes (Signed)
Physical Therapy Treatment Patient Details Name: Jack Huerta MRN: [id number] DOB: September 17, 1945 Today's Date: 04/03/2019    History of Present Illness 73 y/o male s/p R THA with direct anterior approach (04/01/19)    PT Comments    Pt in bed and eager to participate in PT upon PT arrival. Pt demos sig improvements in bed mobility, transfers, and gait tolerance today with no sig drop in PB upon moving from supine in bed to standing. Pt requires supervision to move to EOB and min guard to stand, but then was able to demo good amb (step through with good wt shift and speed 0.47m/s) and navigation of 3 steps. Pt will continue to benefit from skilled PT to progress to baseline function.     Follow Up Recommendations  SNF     Equipment Recommendations  None recommended by PT    Recommendations for Other Services       Precautions / Restrictions Precautions Precautions: Fall;Anterior Hip Precaution Booklet Issued: No Restrictions Weight Bearing Restrictions: Yes RLE Weight Bearing: Weight bearing as tolerated    Mobility  Bed Mobility Overal bed mobility: Needs Assistance Bed Mobility: Supine to Sit     Supine to sit: Modified independent (Device/Increase time);HOB elevated     General bed mobility comments: Pt used elevated HOB and bed rails, but would likely be able to move without these if asked. No c/o pain.  Transfers Overall transfer level: Needs assistance Equipment used: Rolling walker (2 wheeled) Transfers: Sit to/from Stand Sit to Stand: Min guard         General transfer comment: Cues for hand placement, pt stood before placement of gait belt, and was able to stand without use of UE, but demos slight post lean immediately upon standing.  Ambulation/Gait Ambulation/Gait assistance: Supervision Gait Distance (Feet): 400 Feet Assistive device: Rolling walker (2 wheeled) Gait Pattern/deviations: Step-through pattern Gait velocity: 0.52m/s Gait velocity  interpretation: <1.8 ft/sec, indicate of risk for recurrent falls General Gait Details: Pt reports significant improvements in pain and mobility, able to ambulate multiple times around unit and demos improved gait pattern with heel-toe pattern and similar stride lengths with good wt shift.   Stairs Stairs: Yes Stairs assistance: Min guard Stair Management: Two rails Number of Stairs: 3 General stair comments: 3 steps x 2. min guard for safety and lines, pt verbalizes understanding of stair technique   Wheelchair Mobility    Modified Rankin (Stroke Patients Only)       Balance Overall balance assessment: Needs assistance Sitting-balance support: No upper extremity supported;Feet supported Sitting balance-Leahy Scale: Good     Standing balance support: Bilateral upper extremity supported;During functional activity Standing balance-Leahy Scale: Fair Standing balance comment: Pt able to static stand without UE support                            Cognition Arousal/Alertness: Awake/alert Behavior During Therapy: WFL for tasks assessed/performed Overall Cognitive Status: Within Functional Limits for tasks assessed                                 General Comments: Pt very eager to participate and receive more therapy, slightly impulsive at times (standing before instructed and without gait belt yet placed) but is redirectable and appears to be more clear cognitively.      Exercises      General Comments  Pertinent Vitals/Pain Pain Assessment: 0-10 Pain Score: 1  Pain Location: R hip Pain Descriptors / Indicators: Grimacing;Sore Pain Intervention(s): Limited activity within patient's tolerance;Monitored during session;Repositioned    Home Living                      Prior Function            PT Goals (current goals can now be found in the care plan section) Acute Rehab PT Goals Patient Stated Goal: go to short term rehab PT  Goal Formulation: With patient Time For Goal Achievement: 04/09/19 Potential to Achieve Goals: Good Progress towards PT goals: Progressing toward goals    Frequency    7X/week      PT Plan Current plan remains appropriate    Co-evaluation              AM-PAC PT "6 Clicks" Mobility   Outcome Measure  Help needed turning from your back to your side while in a flat bed without using bedrails?: None Help needed moving from lying on your back to sitting on the side of a flat bed without using bedrails?: None Help needed moving to and from a bed to a chair (including a wheelchair)?: A Little Help needed standing up from a chair using your arms (e.g., wheelchair or bedside chair)?: A Little Help needed to walk in hospital room?: A Little Help needed climbing 3-5 steps with a railing? : A Little 6 Click Score: 20    End of Session Equipment Utilized During Treatment: Gait belt Activity Tolerance: Patient tolerated treatment well Patient left: in chair;with call bell/phone within reach Nurse Communication: Mobility status PT Visit Diagnosis: Unsteadiness on feet (R26.81);Difficulty in walking, not elsewhere classified (R26.2);Pain Pain - Right/Left: Right Pain - part of body: Hip     Time: [id number] PT Time Calculation (min) (ACUTE ONLY): 33 min  Charges:  $Gait Training: 23-37 mins                     Mickey Farber, PT, DPT   Acute Rehabilitation Department [phone]   [name] 04/03/2019, 1:49 PM

## 2019-04-03 NOTE — TOC Initial Note (Signed)
Transition of Care [facility]) - Initial/Assessment Note    Patient Details  Name: Jack Huerta MRN: [id number] Date of Birth: June 04, 1945  [facility] CM/SW Contact:    Midge Minium RN, BSN, NCM-BC, ACM-RN [phone]  04/03/2019, 10:09 AM  Clinical Narrative:                 CM following for dispositional needs. CM spoke to the patient to discuss the POC. Patient is a 73 y/o male s/p R THA with direct anterior approach. Patient lives at home and was very active and independent with his ADLs PTA. PT/OT eval completed with SNF recommended for ST rehab. CM discussed the recommendations; patient stated he had already contacted [facility] for SNF placement PTA and would like for the referral to be sent to the facility. CM left a VM with Whitney ([facility] liaison); FL2/PASRR completed and faxed to the liaison for review. Patient will require insurance auth with bed availability pending. CM team will continue to follow.   Expected Discharge Plan: Skilled Nursing Facility Barriers to Discharge: Continued Medical Work up, Ship broker   Patient Goals and CMS Choice Patient states their goals for this hospitalization and ongoing recovery are:: "to get better to go home" CMS Medicare.gov Compare Post Acute Care list provided to:: Patient Choice offered to / list presented to : Patient  Expected Discharge Plan and Services Expected Discharge Plan: [place] In-house Referral: NA Discharge Planning Services: CM Consult Post Acute Care Choice: [place] Living arrangements for the past 2 months: Single Family Home                 DME Arranged: N/A DME Agency: NA       HH Arranged: NA HH Agency: NA        Prior Living Arrangements/Services Living arrangements for the past 2 months: Single Family Home Lives with:: Self, Spouse Patient language and need for interpreter reviewed:: Yes Do you feel safe going back to the place where you  live?: Yes      Need for Family Participation in Patient Care: Yes (Comment) Care giver support system in place?: No (comment) Current home services: DME Criminal Activity/Legal Involvement Pertinent to Current Situation/Hospitalization: No - Comment as needed  Activities of Daily Living      Permission Sought/Granted Permission sought to share information with : Case Manager, Customer service manager Permission granted to share information with : Yes, Verbal Permission Granted     Permission granted to share info w AGENCY: [facility] SNF        Emotional Assessment   Attitude/Demeanor/Rapport: Gracious Affect (typically observed): Pleasant, Accepting Orientation: : Oriented to Place, Oriented to Self, Oriented to  Time, Oriented to Situation Alcohol / Substance Use: Not Applicable Psych Involvement: No (comment)  Admission diagnosis:  osteoarthritis right hip Patient Active Problem List   Diagnosis Date Noted  . Status post total replacement of right hip 04/01/2019  . Unilateral primary osteoarthritis, right hip 02/18/2019  . Right sided sciatica 10/22/2018  . ASCVD (arteriosclerotic cardiovascular disease) > 10 06/05/2018  . Pain in joint of right hip 07/26/2017  . PSVT (paroxysmal supraventricular tachycardia) ([place]) 07/24/2014  . Diverticulosis of colon (without mention of hemorrhage) 08/07/2011   PCP:  [name], DO Pharmacy:   [facility] # [address] [place] [address] Phone: [phone] Fax: [phone]     Social Determinants of Health (SDOH) Interventions  Readmission Risk Interventions No flowsheet data found.

## 2019-04-03 NOTE — [place] FL2 (Signed)
[place] MEDICAID FL2 LEVEL OF CARE SCREENING TOOL     IDENTIFICATION  Patient Name: Jack Huerta Birthdate: 12-Nov-1945 Sex: male Admission Date (Current Location): 04/01/2019  [facility] and [place] Number:  Herbalist and Address:  [facility] [place], [place] [address], [place], [place] [address]      Provider Number: [id number]  Attending Physician Name and Address:  Mcarthur Rossetti,*  Relative Name and Phone Number:  [name] [phone]    Current Level of Care: Hospital Recommended Level of Care: [place] Prior Approval Number:    Date Approved/Denied:   PASRR Number: [id number]  Discharge Plan: SNF    Current Diagnoses: Patient Active Problem List   Diagnosis Date Noted  . Status post total replacement of right hip 04/01/2019  . Unilateral primary osteoarthritis, right hip 02/18/2019  . Right sided sciatica 10/22/2018  . ASCVD (arteriosclerotic cardiovascular disease) > 10 06/05/2018  . Pain in joint of right hip 07/26/2017  . PSVT (paroxysmal supraventricular tachycardia) ([place]) 07/24/2014  . Diverticulosis of colon (without mention of hemorrhage) 08/07/2011    Orientation RESPIRATION BLADDER Height & Weight     Self, Time, Situation, Place  Normal Continent Weight: 65.3 kg Height:  5\' 9"  (175.3 cm)  BEHAVIORAL SYMPTOMS/MOOD NEUROLOGICAL BOWEL NUTRITION STATUS  Other (Comment)(N/A) (N/A) Continent Diet(regular)  AMBULATORY STATUS COMMUNICATION OF NEEDS Skin   Limited Assist Verbally Surgical wounds(Right Hip)                       Personal Care Assistance Level of Assistance  Bathing, Feeding, Dressing, Total care Bathing Assistance: Limited assistance Feeding assistance: Limited assistance Dressing Assistance: Limited assistance Total Care Assistance: Limited assistance   Functional Limitations Info  Sight, Hearing, Speech Sight Info: Adequate Hearing Info: Adequate Speech Info:  Adequate    SPECIAL CARE FACTORS FREQUENCY  PT (By licensed PT), OT (By licensed OT)     PT Frequency: 7X/week minimal OT Frequency: 2x/week minimal            Contractures Contractures Info: Not present    Additional Factors Info  Code Status, Allergies Code Status Info: Full Code Allergies Info: NKDA           Current Medications (04/03/2019):  This is the current hospital active medication list Current Facility-Administered Medications  Medication Dose Route Frequency Provider Last Rate Last Dose  . 0.9 %  sodium chloride infusion   Intravenous Continuous Mcarthur Rossetti, MD 75 mL/hr at 04/01/19 2038    . acetaminophen (TYLENOL) tablet 325-650 mg  325-650 mg Oral Q6H PRN Mcarthur Rossetti, MD      . alum & mag hydroxide-simeth (MAALOX/MYLANTA) 200-200-20 MG/5ML suspension 30 mL  30 mL Oral Q4H PRN Mcarthur Rossetti, MD      . aspirin chewable tablet 81 mg  81 mg Oral BID Mcarthur Rossetti, MD   81 mg at 04/03/19 0926  . atenolol (TENORMIN) tablet 25 mg  25 mg Oral Daily Mcarthur Rossetti, MD   25 mg at 04/03/19 [id number]  . diphenhydrAMINE (BENADRYL) 12.5 MG/5ML elixir 12.5-25 mg  12.5-25 mg Oral Q4H PRN Mcarthur Rossetti, MD      . docusate sodium (COLACE) capsule 100 mg  100 mg Oral BID Mcarthur Rossetti, MD   100 mg at 04/03/19 [id number]  . HYDROmorphone (DILAUDID) injection 0.5-1 mg  0.5-1 mg Intravenous Q4H PRN Mcarthur Rossetti, MD      . ketorolac (  TORADOL) 15 MG/ML injection 7.5 mg  7.5 mg Intravenous Q6H Mcarthur Rossetti, MD   7.5 mg at 04/03/19 0550  . menthol-cetylpyridinium (CEPACOL) lozenge 3 mg  1 lozenge Oral PRN Mcarthur Rossetti, MD       Or  . phenol (CHLORASEPTIC) mouth spray 1 spray  1 spray Mouth/Throat PRN Mcarthur Rossetti, MD      . methocarbamol (ROBAXIN) tablet 500 mg  500 mg Oral Q6H PRN Mcarthur Rossetti, MD   500 mg at 04/01/19 2017   Or  . methocarbamol (ROBAXIN) 500 mg in  dextrose 5 % 50 mL IVPB  500 mg Intravenous Q6H PRN Mcarthur Rossetti, MD      . metoCLOPramide (REGLAN) tablet 5-10 mg  5-10 mg Oral Q8H PRN Mcarthur Rossetti, MD       Or  . metoCLOPramide (REGLAN) injection 5-10 mg  5-10 mg Intravenous Q8H PRN Mcarthur Rossetti, MD      . multivitamin with minerals tablet 1 tablet  1 tablet Oral Daily Mirian Capuchin, RPH   1 tablet at 04/03/19 [id number]  . ondansetron (ZOFRAN) tablet 4 mg  4 mg Oral Q6H PRN Mcarthur Rossetti, MD       Or  . ondansetron [facility]) injection 4 mg  4 mg Intravenous Q6H PRN Mcarthur Rossetti, MD      . oxyCODONE (Oxy IR/ROXICODONE) immediate release tablet 10-15 mg  10-15 mg Oral Q4H PRN Mcarthur Rossetti, MD      . oxyCODONE (Oxy IR/ROXICODONE) immediate release tablet 5-10 mg  5-10 mg Oral Q4H PRN Mcarthur Rossetti, MD   10 mg at 04/02/19 1003  . pantoprazole (PROTONIX) EC tablet 40 mg  40 mg Oral Daily Mcarthur Rossetti, MD   40 mg at 04/03/19 [id number]  . polyethylene glycol (MIRALAX / GLYCOLAX) packet 17 g  17 g Oral Daily PRN Mcarthur Rossetti, MD      . vitamin C (ASCORBIC ACID) tablet 1,000 mg  1,000 mg Oral Daily Mcarthur Rossetti, MD   1,000 mg at 04/03/19 [id number]     Discharge Medications: Please see discharge summary for a list of discharge medications.  Relevant Imaging Results:  Relevant Lab Results:   Additional Information SSN: [id number]  [place], BSN, NCM-BC, ACM-RN [phone]

## 2019-04-03 NOTE — Progress Notes (Signed)
Physical Therapy Treatment Patient Details Name: Jack Huerta MRN: [id number] DOB: 09/10/1945 Today's Date: 04/03/2019    History of Present Illness 73 y/o male s/p R THA with direct anterior approach (04/01/19)    PT Comments    Pt in bed upon PT arrival, agreeable to PT session. Pt reports continued good pain management, no c/o pain. Pt educated in multiple LE exercises that can be done supine in bed and seated EOB, pt able to demo with good technique. Pt then ambulated in hall with RW and supervision, demos good gait speed and no changes in pain. Pt will continue to benefit from skilled PT to maximize rehab and return to prior level of function and independence.     Follow Up Recommendations  SNF     Equipment Recommendations  None recommended by PT    Recommendations for Other Services       Precautions / Restrictions Precautions Precautions: Fall;Anterior Hip Precaution Booklet Issued: Yes (comment) Restrictions Weight Bearing Restrictions: Yes RLE Weight Bearing: Weight bearing as tolerated    Mobility  Bed Mobility Overal bed mobility: Needs Assistance Bed Mobility: Supine to Sit     Supine to sit: Modified independent (Device/Increase time)     General bed mobility comments: Pt used bed rails, but would likely be able to move without these if asked. No c/o pain.  Transfers Overall transfer level: Needs assistance Equipment used: Rolling walker (2 wheeled) Transfers: Sit to/from Stand Sit to Stand: Min guard         General transfer comment: Cues for hand placement, pt stood without use of UE, but demos slight post lean immediately upon standing, benefits from VCs for improved safety with standing  Ambulation/Gait Ambulation/Gait assistance: Supervision Gait Distance (Feet): 250 Feet Assistive device: Rolling walker (2 wheeled) Gait Pattern/deviations: Step-through pattern;WFL(Within Functional Limits) Gait velocity: 0.49m/s Gait velocity  interpretation: <1.8 ft/sec, indicate of risk for recurrent falls General Gait Details: Pt able to ambulate around unit and demos improved gait pattern with heel-toe pattern and similar stride lengths with good wt shift.   Stairs Stairs: Yes Stairs assistance: Min guard Stair Management: Two rails Number of Stairs: 3 General stair comments: 3 steps x 2. min guard for safety and lines, pt verbalizes understanding of stair technique   Wheelchair Mobility    Modified Rankin (Stroke Patients Only)       Balance Overall balance assessment: Needs assistance Sitting-balance support: No upper extremity supported;Feet supported Sitting balance-Leahy Scale: Good     Standing balance support: Bilateral upper extremity supported;During functional activity Standing balance-Leahy Scale: Fair Standing balance comment: Pt able to static stand without UE support                            Cognition Arousal/Alertness: Awake/alert Behavior During Therapy: WFL for tasks assessed/performed Overall Cognitive Status: Within Functional Limits for tasks assessed                                 General Comments: Pt very eager to participate and receive more therapy      Exercises Total Joint Exercises Ankle Circles/Pumps: AROM;Both;10 reps;Supine Quad Sets: AROM;Right;10 reps;Supine Heel Slides: AROM;10 reps;Both;Supine Long Arc Quad: AROM;Both;10 reps;Seated Marching in Standing: AROM;Both;10 reps;Standing Other Exercises Other Exercises: Seated Calf Raises - both, 2 x 10    General Comments        Pertinent Vitals/Pain Pain  Assessment: No/denies pain Pain Score: 1  Pain Location: R hip Pain Descriptors / Indicators: Grimacing;Sore Pain Intervention(s): Limited activity within patient's tolerance;Monitored during session;Repositioned    Home Living Family/patient expects to be discharged to:: Private residence Living Arrangements: Alone                   Prior Function            PT Goals (current goals can now be found in the care plan section) Acute Rehab PT Goals Patient Stated Goal: go to short term rehab PT Goal Formulation: With patient Time For Goal Achievement: 04/09/19 Potential to Achieve Goals: Good Progress towards PT goals: Progressing toward goals    Frequency    7X/week      PT Plan Current plan remains appropriate    Co-evaluation              AM-PAC PT "6 Clicks" Mobility   Outcome Measure  Help needed turning from your back to your side while in a flat bed without using bedrails?: None Help needed moving from lying on your back to sitting on the side of a flat bed without using bedrails?: None Help needed moving to and from a bed to a chair (including a wheelchair)?: A Little Help needed standing up from a chair using your arms (e.g., wheelchair or bedside chair)?: A Little Help needed to walk in hospital room?: A Little Help needed climbing 3-5 steps with a railing? : A Little 6 Click Score: 20    End of Session Equipment Utilized During Treatment: Gait belt Activity Tolerance: Patient tolerated treatment well Patient left: in bed;with call bell/phone within reach Nurse Communication: Mobility status PT Visit Diagnosis: Unsteadiness on feet (R26.81);Difficulty in walking, not elsewhere classified (R26.2);Pain Pain - Right/Left: Right Pain - part of body: Hip     Time: [id number] PT Time Calculation (min) (ACUTE ONLY): 20 min  Charges:  $Gait Training: 23-37 mins $Therapeutic Exercise: 8-22 mins                     Mickey Farber, PT, DPT   Acute Rehabilitation Department [phone]   [name] 04/03/2019, 4:14 PM

## 2019-04-03 NOTE — Plan of Care (Signed)
  Problem: Education: Goal: Knowledge of General Education information will improve Description Including pain rating scale, medication(s)/side effects and non-pharmacologic comfort measures Outcome: Progressing   Problem: Coping: Goal: Level of anxiety will decrease Outcome: Progressing   Problem: Elimination: Goal: Will not experience complications related to urinary retention Outcome: Progressing   Problem: Pain Managment: Goal: General experience of comfort will improve Outcome: Progressing   Problem: Safety: Goal: Ability to remain free from injury will improve Outcome: Progressing   

## 2019-04-03 NOTE — Progress Notes (Signed)
   04/03/19 1231  SNF Authorization Status  SNF Authorization Type SNF Facility Authorization Request  SNF Auth Started 04/03/19  SNF Auth Start Time 1030  SNF Authorization Status Started   CM spoke to [place] (Admissions Coordinator/ [facility]); insurance auth has been initiated by the facility for ST SNF placement.  Midge Minium RN, BSN, NCM-BC, ACM-RN [phone]

## 2019-04-04 ENCOUNTER — Encounter: Payer: Self-pay | Admitting: Orthopaedic Surgery

## 2019-04-04 MED ORDER — HYDROCODONE-ACETAMINOPHEN 7.5-325 MG PO TABS
1.0000 | ORAL_TABLET | Freq: Four times a day (QID) | ORAL | Status: DC | PRN
  Administered 2019-04-04: 1 via ORAL
  Filled 2019-04-04: qty 1

## 2019-04-04 MED ORDER — HYDROCODONE-ACETAMINOPHEN 7.5-325 MG PO TABS: 1.0000 | ORAL_TABLET | Freq: Four times a day (QID) | ORAL | 0 refills | Status: DC | PRN

## 2019-04-04 MED ORDER — CELECOXIB 200 MG PO CAPS: 200.0000 mg | ORAL_CAPSULE | Freq: Two times a day (BID) | ORAL | 2 refills | Status: DC | PRN

## 2019-04-04 NOTE — Progress Notes (Signed)
Physical Therapy Treatment Patient Details Name: Jack Huerta MRN: [id number] DOB: [birth year] Today's Date: 04/04/2019    History of Present Illness 73 y/o male s/p R THA with direct anterior approach (04/01/19)    PT Comments    Pt in bed upon PT arrival, agreeable to PT session. Pt was able to demo good independence with bed mobility and transfers, not requiring VCs for technique or sequencing. Pt continues to demo good ambulation, and was able to progress to ambulating without use of AD during session. Pt demos good gait speed without AD, but has increased lateral movement, more antalgic gait, and appears to be less steady on his feet despite not having an actual LOB. The patient completed a 5xsit-to-stand (5XSTS) in 11s with supervision and no use of UE. This is below the age-based cut-off of 12.2s and therefore indicates they are not at a significant increased risk for falls. Due to the pt's performance today and general presentation, this is generally a patient I would recommend return home with HHPT, but the pt has specifically requested d/c to SNF for rehab following his hospital stay. The patient will still continue to benefit from skilled PT to address limitations in functional strength, mobility, and balance.   Follow Up Recommendations  SNF     Equipment Recommendations  None recommended by PT    Recommendations for Other Services       Precautions / Restrictions Precautions Precautions: Fall;Anterior Hip Precaution Booklet Issued: Yes (comment) Restrictions Weight Bearing Restrictions: Yes RLE Weight Bearing: Weight bearing as tolerated    Mobility  Bed Mobility Overal bed mobility: Needs Assistance Bed Mobility: Supine to Sit     Supine to sit: Modified independent (Device/Increase time)     General bed mobility comments: Pt used bed rails, but would likely be able to move without these if asked. No c/o pain.  Transfers Overall transfer level: Needs  assistance Equipment used: Rolling walker (2 wheeled) Transfers: Sit to/from Stand Sit to Stand: Min guard         General transfer comment: Cues for hand placement, pt stood without use of UE, benefits from VCs for improved safety with standing  Ambulation/Gait Ambulation/Gait assistance: Supervision Gait Distance (Feet): 250 Feet Assistive device: Rolling walker (2 wheeled);None Gait Pattern/deviations: Step-through pattern;WFL(Within Functional Limits) Gait velocity: 0.68m/s Gait velocity interpretation: <1.8 ft/sec, indicate of risk for recurrent falls General Gait Details: Pt ambulated 100 ft to PT gym with RW and supervision, then 150 ft back to room using no AD and min guard. Pt demos slight increase in lateral sway and antalgic gait, but no LOB   Stairs             Wheelchair Mobility    Modified Rankin (Stroke Patients Only)       Balance Overall balance assessment: Needs assistance Sitting-balance support: No upper extremity supported;Feet supported Sitting balance-Leahy Scale: Good     Standing balance support: No upper extremity supported;During functional activity Standing balance-Leahy Scale: Fair Standing balance comment: Pt able to static stand without UE support                            Cognition Arousal/Alertness: Awake/alert Behavior During Therapy: WFL for tasks assessed/performed Overall Cognitive Status: Within Functional Limits for tasks assessed  General Comments: Pt very eager to participate and receive more therapy      Exercises Total Joint Exercises Long Arc Quad: AROM;Both;10 reps;Seated Marching in Standing: AROM;Both;10 reps;Standing Other Exercises Other Exercises: Seated Calf Raises - both, 2 x 10    General Comments        Pertinent Vitals/Pain Pain Score: 1  Pain Location: R hip Pain Descriptors / Indicators: Grimacing;Sore Pain Intervention(s): Limited  activity within patient's tolerance;Monitored during session;Repositioned;Premedicated before session    Home Living                      Prior Function            PT Goals (current goals can now be found in the care plan section) Acute Rehab PT Goals Patient Stated Goal: go to short term rehab PT Goal Formulation: With patient Time For Goal Achievement: 04/09/19 Potential to Achieve Goals: Good Progress towards PT goals: Progressing toward goals    Frequency    7X/week      PT Plan Current plan remains appropriate    Co-evaluation              AM-PAC PT "6 Clicks" Mobility   Outcome Measure  Help needed turning from your back to your side while in a flat bed without using bedrails?: None Help needed moving from lying on your back to sitting on the side of a flat bed without using bedrails?: None Help needed moving to and from a bed to a chair (including a wheelchair)?: A Little Help needed standing up from a chair using your arms (e.g., wheelchair or bedside chair)?: A Little Help needed to walk in hospital room?: A Little Help needed climbing 3-5 steps with a railing? : A Little 6 Click Score: 20    End of Session Equipment Utilized During Treatment: Gait belt Activity Tolerance: Patient tolerated treatment well Patient left: in chair;with call bell/phone within reach Nurse Communication: Mobility status PT Visit Diagnosis: Unsteadiness on feet (R26.81);Difficulty in walking, not elsewhere classified (R26.2);Pain Pain - Right/Left: Right Pain - part of body: Hip     Time: [id number] PT Time Calculation (min) (ACUTE ONLY): 23 min  Charges:  $Gait Training: 8-22 mins $Therapeutic Exercise: 8-22 mins                     Mickey Farber, PT, DPT   Acute Rehabilitation Department [phone]   [name] 04/04/2019, 11:35 AM

## 2019-04-04 NOTE — Discharge Summary (Signed)
Patient ID: Jack Huerta MRN: [id number] DOB/AGE: [birth year] 75 y.o.  Admit date: 04/01/2019 Discharge date: 04/04/2019  Admission Diagnoses:  Principal Problem:   Unilateral primary osteoarthritis, right hip Active Problems:   Status post total replacement of right hip   Discharge Diagnoses:  Same  Past Medical History:  Diagnosis Date  . Cancer (HCC)    Melanoma (spots removed)  . Chicken pox   . False positive serological test for hepatitis C   . [place] measles   . Hemorrhoids   . Mumps   . SVT (supraventricular tachycardia) ([place]), exercise induced 1993  . Tinnitus     Surgeries: Procedure(s): RIGHT TOTAL HIP ARTHROPLASTY ANTERIOR APPROACH on 04/01/2019   Consultants:   Discharged Condition: Improved  Hospital Course: Jack Huerta is an 73 y.o. male who was admitted 04/01/2019 for operative treatment ofUnilateral primary osteoarthritis, right hip. Patient has severe unremitting pain that affects sleep, daily activities, and work/hobbies. After pre-op clearance the patient was taken to the operating room on 04/01/2019 and underwent  Procedure(s): RIGHT TOTAL HIP ARTHROPLASTY ANTERIOR APPROACH.    Patient was given perioperative antibiotics:  Anti-infectives (From admission, onward)   Start     Dose/Rate Route Frequency Ordered Stop   04/02/19 0600  ceFAZolin (ANCEF) IVPB 2g/100 mL premix     2 g 200 mL/hr over 30 Minutes Intravenous On call to O.R. 04/01/19 1226 04/01/19 1459   04/01/19 1815  ceFAZolin (ANCEF) IVPB 1 g/50 mL premix     1 g 100 mL/hr over 30 Minutes Intravenous Every 6 hours 04/01/19 1802 04/02/19 0252   04/01/19 1230  ceFAZolin (ANCEF) 2-4 GM/100ML-% IVPB    Note to Pharmacy: Gustavo Lah   : cabinet override      04/01/19 1230 04/01/19 1459       Patient was given sequential compression devices, early ambulation, and chemoprophylaxis to prevent DVT.  Patient benefited maximally from hospital stay and there were no complications.     Recent vital signs:  Patient Vitals for the past 24 hrs:  BP Temp Temp src Pulse Resp SpO2  04/04/19 0513 104/64 (!) 97.5 F (36.4 C) Oral 66 - 99 %  04/03/19 2046 (!) 94/52 98.3 F (36.8 C) Oral 63 - 98 %  04/03/19 1258 (!) 110/56 98.1 F (36.7 C) Oral 62 18 100 %  04/03/19 0805 (!) 99/56 98.1 F (36.7 C) Oral 67 18 98 %     Recent laboratory studies:  Recent Labs    04/02/19 0424  WBC 7.2  HGB 12.9*  HCT 38.0*  PLT 159  NA 134*  K 3.6  CL 99  CO2 27  BUN 13  CREATININE 0.84  GLUCOSE 116*  CALCIUM 8.2*     Discharge Medications:   Allergies as of 04/04/2019   No Known Allergies     Medication List    STOP taking these medications   aspirin 81 MG tablet Replaced by: aspirin 81 MG chewable tablet     TAKE these medications   aspirin 81 MG chewable tablet Chew 1 tablet (81 mg total) by mouth 2 (two) times daily. Replaces: aspirin 81 MG tablet   atenolol 25 MG tablet Commonly known as: TENORMIN TAKE 1 TABLET BY MOUTH ONCE A DAY   Biotin 5 MG Caps Take 5 mg by mouth daily.   Magnesium 250 MG Tabs Take 250 mg by mouth daily.   methocarbamol 500 MG tablet Commonly known as: ROBAXIN Take 1 tablet (500 mg total) by mouth every  6 (six) hours as needed for muscle spasms.   multivitamin with minerals tablet Take 1 tablet by mouth daily.   oxyCODONE 5 MG immediate release tablet Commonly known as: Oxy IR/ROXICODONE Take 1-2 tablets (5-10 mg total) by mouth every 4 (four) hours as needed for moderate pain (pain score 4-6).   vitamin C 1000 MG tablet Take 1,000 mg by mouth daily.            Durable Medical Equipment  (From admission, onward)         Start     Ordered   04/01/19 1803  DME 3 n 1  Once     04/01/19 1802   04/01/19 1803  DME Walker rolling  Once    Question:  Patient needs a walker to treat with the following condition  Answer:  Status post total replacement of right hip   04/01/19 1802          Diagnostic Studies: Dg  Pelvis Portable  Result Date: 04/01/2019 CLINICAL DATA:  Right total hip arthroplasty EXAM: PORTABLE PELVIS 1-2 VIEWS COMPARISON:  06/03/2018 pelvic radiograph FINDINGS: Status post right total hip arthroplasty with well-positioned right acetabular and proximal right femoral prostheses. No hardware fracture. No evidence of hip dislocation on this single frontal view. Expected postoperative soft tissue gas surrounding the right hip. Vertical skin staples lateral to the right hip. No osseous fracture. No suspicious focal osseous lesions. IMPRESSION: Satisfactory immediate postoperative single frontal view appearance status post right total hip arthroplasty. Electronically Signed   By: Ilona Sorrel M.D.   On: 04/01/2019 17:11   Dg C-arm 1-60 Min  Result Date: 04/01/2019 CLINICAL DATA:  Osteoarthritis of the right hip. Status post right total hip arthroplasty. EXAM: OPERATIVE RIGHT HIP (WITH PELVIS IF PERFORMED) 1 VIEW TECHNIQUE: Fluoroscopic spot image(s) were submitted for interpretation post-operatively. COMPARISON:  radiographs dated 06/03/2018 FINDINGS: AP views of the right hip and pelvis demonstrate that the acetabular and femoral components of the right total hip prosthesis appear in excellent position in the AP projection. No fractures. IMPRESSION: Satisfactory appearance of the right total hip prosthesis. FLUOROSCOPY TIME:  17 seconds C-arm fluoroscopic images were obtained intraoperatively and submitted for post operative interpretation. Electronically Signed   By: Lorriane Shire M.D.   On: 04/01/2019 16:07   Dg Hip Operative Unilat W Or W/o Pelvis Right  Result Date: 04/01/2019 CLINICAL DATA:  Osteoarthritis of the right hip. Status post right total hip arthroplasty. EXAM: OPERATIVE RIGHT HIP (WITH PELVIS IF PERFORMED) 1 VIEW TECHNIQUE: Fluoroscopic spot image(s) were submitted for interpretation post-operatively. COMPARISON:  radiographs dated 06/03/2018 FINDINGS: AP views of the right hip and  pelvis demonstrate that the acetabular and femoral components of the right total hip prosthesis appear in excellent position in the AP projection. No fractures. IMPRESSION: Satisfactory appearance of the right total hip prosthesis. FLUOROSCOPY TIME:  17 seconds C-arm fluoroscopic images were obtained intraoperatively and submitted for post operative interpretation. Electronically Signed   By: Lorriane Shire M.D.   On: 04/01/2019 16:07    Disposition: Discharge disposition: 03-Skilled [facility]    Mcarthur Rossetti, MD Follow up in 2 week(s).   Specialty: Orthopedic Surgery Why: Our new address is [address] This is right next door to our old Estate manager/land agent information: [place] [address] [phone]        [facility] Follow up.   Specialties: Skilled  Nursing Facility, [facility] Why: rehab Contact information: [address] [place] [phone]           Signed: Mcarthur Rossetti 04/04/2019, 6:37 AM

## 2019-04-04 NOTE — Discharge Summary (Signed)
Patient ID: Jack Huerta MRN: [id number] DOB/AGE: 75-Nov-1947 75 y.o.  Admit date: 04/01/2019 Discharge date: 04/04/2019  Admission Diagnoses:  Principal Problem:   Unilateral primary osteoarthritis, right hip Active Problems:   Status post total replacement of right hip   Discharge Diagnoses:  Same  Past Medical History:  Diagnosis Date  . Cancer (HCC)    Melanoma (spots removed)  . Chicken pox   . False positive serological test for hepatitis C   . [place] measles   . Hemorrhoids   . Mumps   . SVT (supraventricular tachycardia) ([place]), exercise induced 1993  . Tinnitus     Surgeries: Procedure(s): RIGHT TOTAL HIP ARTHROPLASTY ANTERIOR APPROACH on 04/01/2019   Consultants:   Discharged Condition: Improved  Hospital Course: Jack Huerta is an 73 y.o. male who was admitted 04/01/2019 for operative treatment ofUnilateral primary osteoarthritis, right hip. Patient has severe unremitting pain that affects sleep, daily activities, and work/hobbies. After pre-op clearance the patient was taken to the operating room on 04/01/2019 and underwent  Procedure(s): RIGHT TOTAL HIP ARTHROPLASTY ANTERIOR APPROACH.    Patient was given perioperative antibiotics:  Anti-infectives (From admission, onward)   Start     Dose/Rate Route Frequency Ordered Stop   04/02/19 0600  ceFAZolin (ANCEF) IVPB 2g/100 mL premix     2 g 200 mL/hr over 30 Minutes Intravenous On call to O.R. 04/01/19 1226 04/01/19 1459   04/01/19 1815  ceFAZolin (ANCEF) IVPB 1 g/50 mL premix     1 g 100 mL/hr over 30 Minutes Intravenous Every 6 hours 04/01/19 1802 04/02/19 0252   04/01/19 1230  ceFAZolin (ANCEF) 2-4 GM/100ML-% IVPB    Note to Pharmacy: Gustavo Lah   : cabinet override      04/01/19 1230 04/01/19 1459       Patient was given sequential compression devices, early ambulation, and chemoprophylaxis to prevent DVT.  Patient benefited maximally from hospital stay and there were no complications.     Recent vital signs:  Patient Vitals for the past 24 hrs:  BP Temp Temp src Pulse Resp SpO2  04/04/19 0513 104/64 (!) 97.5 F (36.4 C) Oral 66 - 99 %  04/03/19 2046 (!) 94/52 98.3 F (36.8 C) Oral 63 - 98 %  04/03/19 1258 (!) 110/56 98.1 F (36.7 C) Oral 62 18 100 %  04/03/19 0805 (!) 99/56 98.1 F (36.7 C) Oral 67 18 98 %     Recent laboratory studies:  Recent Labs    04/02/19 0424  WBC 7.2  HGB 12.9*  HCT 38.0*  PLT 159  NA 134*  K 3.6  CL 99  CO2 27  BUN 13  CREATININE 0.84  GLUCOSE 116*  CALCIUM 8.2*     Discharge Medications:   Allergies as of 04/04/2019   No Known Allergies     Medication List    STOP taking these medications   aspirin 81 MG tablet Replaced by: aspirin 81 MG chewable tablet     TAKE these medications   aspirin 81 MG chewable tablet Chew 1 tablet (81 mg total) by mouth 2 (two) times daily. Replaces: aspirin 81 MG tablet   atenolol 25 MG tablet Commonly known as: TENORMIN TAKE 1 TABLET BY MOUTH ONCE A DAY   Biotin 5 MG Caps Take 5 mg by mouth daily.   celecoxib 200 MG capsule Commonly known as: CeleBREX Take 1 capsule (200 mg total) by mouth 2 (two) times daily between meals as needed.   Magnesium 250 MG  Tabs Take 250 mg by mouth daily.   methocarbamol 500 MG tablet Commonly known as: ROBAXIN Take 1 tablet (500 mg total) by mouth every 6 (six) hours as needed for muscle spasms.   multivitamin with minerals tablet Take 1 tablet by mouth daily.   oxyCODONE 5 MG immediate release tablet Commonly known as: Oxy IR/ROXICODONE Take 1-2 tablets (5-10 mg total) by mouth every 4 (four) hours as needed for moderate pain (pain score 4-6).   vitamin C 1000 MG tablet Take 1,000 mg by mouth daily.            Durable Medical Equipment  (From admission, onward)         Start     Ordered   04/01/19 1803  DME 3 n 1  Once     04/01/19 1802   04/01/19 1803  DME Walker rolling  Once    Question:  Patient needs a walker to  treat with the following condition  Answer:  Status post total replacement of right hip   04/01/19 1802          Diagnostic Studies: Dg Pelvis Portable  Result Date: 04/01/2019 CLINICAL DATA:  Right total hip arthroplasty EXAM: PORTABLE PELVIS 1-2 VIEWS COMPARISON:  06/03/2018 pelvic radiograph FINDINGS: Status post right total hip arthroplasty with well-positioned right acetabular and proximal right femoral prostheses. No hardware fracture. No evidence of hip dislocation on this single frontal view. Expected postoperative soft tissue gas surrounding the right hip. Vertical skin staples lateral to the right hip. No osseous fracture. No suspicious focal osseous lesions. IMPRESSION: Satisfactory immediate postoperative single frontal view appearance status post right total hip arthroplasty. Electronically Signed   By: Ilona Sorrel M.D.   On: 04/01/2019 17:11   Dg C-arm 1-60 Min  Result Date: 04/01/2019 CLINICAL DATA:  Osteoarthritis of the right hip. Status post right total hip arthroplasty. EXAM: OPERATIVE RIGHT HIP (WITH PELVIS IF PERFORMED) 1 VIEW TECHNIQUE: Fluoroscopic spot image(s) were submitted for interpretation post-operatively. COMPARISON:  radiographs dated 06/03/2018 FINDINGS: AP views of the right hip and pelvis demonstrate that the acetabular and femoral components of the right total hip prosthesis appear in excellent position in the AP projection. No fractures. IMPRESSION: Satisfactory appearance of the right total hip prosthesis. FLUOROSCOPY TIME:  17 seconds C-arm fluoroscopic images were obtained intraoperatively and submitted for post operative interpretation. Electronically Signed   By: Lorriane Shire M.D.   On: 04/01/2019 16:07   Dg Hip Operative Unilat W Or W/o Pelvis Right  Result Date: 04/01/2019 CLINICAL DATA:  Osteoarthritis of the right hip. Status post right total hip arthroplasty. EXAM: OPERATIVE RIGHT HIP (WITH PELVIS IF PERFORMED) 1 VIEW TECHNIQUE: Fluoroscopic spot  image(s) were submitted for interpretation post-operatively. COMPARISON:  radiographs dated 06/03/2018 FINDINGS: AP views of the right hip and pelvis demonstrate that the acetabular and femoral components of the right total hip prosthesis appear in excellent position in the AP projection. No fractures. IMPRESSION: Satisfactory appearance of the right total hip prosthesis. FLUOROSCOPY TIME:  17 seconds C-arm fluoroscopic images were obtained intraoperatively and submitted for post operative interpretation. Electronically Signed   By: Lorriane Shire M.D.   On: 04/01/2019 16:07    Disposition: Discharge disposition: 03-Skilled [facility]    Mcarthur Rossetti, MD Follow up in 2 week(s).   Specialty: Orthopedic Surgery Why: Our new address is [address] This is right next door to our old Estate manager/land agent information:  [address] [place] [address] [address] [phone]        [facility]REF[facility] SNF/[facility]F Follow up.   Specialties: [place], [place] Why: rehab Contact information: [address] [place] [phone]           Signed: Mcarthur Rossetti 04/04/2019, 6:42 AM

## 2019-04-04 NOTE — Progress Notes (Signed)
Patient ID: Jack Huerta, male   DOB: June 13, 1945, 73 y.o.   MRN: [id number] Looks good this am.  Vitals stable.  Right hip stable.  Can be discharged to skilled nursing today.

## 2019-04-04 NOTE — Progress Notes (Signed)
Physical Therapy Treatment Patient Details Name: Jack Huerta MRN: [id number] DOB: 1946/01/13 Today's Date: 04/04/2019    History of Present Illness 73 y/o male s/p R THA with direct anterior approach (04/01/19)    PT Comments    Pt in bed upon PT arrival, willing to participate in therapy. Pt was able to demonstrate continued good independence with bed mobility and good transfers without AD. The pt was able to amb without AD, but given [facility] which significantly improved lateral sway and pt feeling of "being wobbly". Pt continues to demo good mobility and balance, was able to tolerate progression of LE exercises to include standing calf raises and HS curls. Pt will continue to benefit from skilled PT to maximize rehab and return to prior level of function and independence.   Follow Up Recommendations  SNF     Equipment Recommendations  None recommended by PT    Recommendations for Other Services       Precautions / Restrictions Precautions Precautions: Fall;Anterior Hip Precaution Booklet Issued: Yes (comment) Restrictions Weight Bearing Restrictions: Yes RLE Weight Bearing: Weight bearing as tolerated    Mobility  Bed Mobility Overal bed mobility: Needs Assistance Bed Mobility: Supine to Sit     Supine to sit: Modified independent (Device/Increase time)     General bed mobility comments: Pt used bed rails, but would likely be able to move without these if asked. No c/o pain.  Transfers Overall transfer level: Needs assistance Equipment used: None Transfers: Sit to/from Stand Sit to Stand: Min guard         General transfer comment: Cues for hand placement, pt stood without use of UE, benefits from VCs for improved safety with standing  Ambulation/Gait Ambulation/Gait assistance: Supervision Gait Distance (Feet): 500 Feet Assistive device: None;Straight cane Gait Pattern/deviations: Step-through pattern;WFL(Within Functional Limits) Gait velocity:  0.18m/s Gait velocity interpretation: <1.8 ft/sec, indicate of risk for recurrent falls General Gait Details: Pt ambulated 100 ft to PT gym with no AD and supervision, then 400 ft back to room using SPC and min guard. Pt demos improvement in lateral sway with SPC   Stairs             Wheelchair Mobility    Modified Rankin (Stroke Patients Only)       Balance Overall balance assessment: Needs assistance Sitting-balance support: No upper extremity supported;Feet supported Sitting balance-Leahy Scale: Normal     Standing balance support: No upper extremity supported;During functional activity Standing balance-Leahy Scale: Fair Standing balance comment: Pt able to static stand without UE support                            Cognition Arousal/Alertness: Awake/alert Behavior During Therapy: WFL for tasks assessed/performed Overall Cognitive Status: Within Functional Limits for tasks assessed                                 General Comments: Pt very eager to participate and receive more therapy      Exercises Total Joint Exercises Long Arc Quad: AROM;Both;10 reps;Seated(2 x 10) Knee Flexion: Standing;AROM;Both;10 reps(2 x 10) Marching in Standing: AROM;Both;10 reps;Standing Other Exercises Other Exercises: Standing Calf Raises - both, 2 x 10 Other Exercises: Seated marches 2 x 10 bilateral    General Comments        Pertinent Vitals/Pain Pain Assessment: No/denies pain Pain Score: 1  Pain Location: R hip Pain  Descriptors / Indicators: Grimacing;Sore Pain Intervention(s): Limited activity within patient's tolerance;Monitored during session;Repositioned;Premedicated before session    Home Living                      Prior Function            PT Goals (current goals can now be found in the care plan section) Acute Rehab PT Goals Patient Stated Goal: go to short term rehab PT Goal Formulation: With patient Time For Goal  Achievement: 04/09/19 Potential to Achieve Goals: Good Progress towards PT goals: Progressing toward goals    Frequency    7X/week      PT Plan Current plan remains appropriate    Co-evaluation              AM-PAC PT "6 Clicks" Mobility   Outcome Measure  Help needed turning from your back to your side while in a flat bed without using bedrails?: None Help needed moving from lying on your back to sitting on the side of a flat bed without using bedrails?: None Help needed moving to and from a bed to a chair (including a wheelchair)?: A Little Help needed standing up from a chair using your arms (e.g., wheelchair or bedside chair)?: A Little Help needed to walk in hospital room?: A Little Help needed climbing 3-5 steps with a railing? : A Little 6 Click Score: 20    End of Session Equipment Utilized During Treatment: Gait belt Activity Tolerance: Patient tolerated treatment well Patient left: in chair;with call bell/phone within reach Nurse Communication: Mobility status PT Visit Diagnosis: Unsteadiness on feet (R26.81);Difficulty in walking, not elsewhere classified (R26.2);Pain Pain - Right/Left: Right Pain - part of body: Hip     Time: [id number] PT Time Calculation (min) (ACUTE ONLY): 25 min  Charges:  $Gait Training: 8-22 mins $Therapeutic Exercise: 8-22 mins                     Mickey Farber, PT, DPT   Acute Rehabilitation Department [phone]   [name] 04/04/2019, 1:49 PM

## 2019-04-04 NOTE — Care Management (Addendum)
CM spoke to Dyer (Admissions Coordinator/ [facility]); insurance auth with [employer] still pending. CM team will continue to follow. Delay days and barriers documented.  Addendum: 04/04/19 @ 1443-Chaundra Abreu RNCM- CM informed by the liaison, [facility]) of the patient being denied insurance auth for his SNF stay, but the MD can complete a peer-to-peer with the medical director by noon on 04/07/19.   PLEASE call: [phone] option 3 Reference number: [id number] Ask for: Dr. Eloise Levels  CM updated the patient who verbalized understanding.   Midge Minium RN, BSN, NCM-BC, ACM-RN [phone]

## 2019-04-05 MED ORDER — METHOCARBAMOL 500 MG PO TABS: 500.0000 mg | ORAL_TABLET | Freq: Four times a day (QID) | ORAL | 0 refills | Status: DC | PRN

## 2019-04-05 MED ORDER — ASPIRIN 81 MG PO CHEW: 81.0000 mg | CHEWABLE_TABLET | Freq: Two times a day (BID) | ORAL | 0 refills | Status: DC

## 2019-04-05 MED ORDER — HYDROCODONE-ACETAMINOPHEN 7.5-325 MG PO TABS: 1.0000 | ORAL_TABLET | Freq: Four times a day (QID) | ORAL | 0 refills | Status: DC | PRN

## 2019-04-05 NOTE — Progress Notes (Addendum)
Pt would like to discuss D/C with home health today to speed up D/C process instead of going to SNF.   Around pt's dressing on R hip, area is warm to touch & swollen. Last WBC done on 04/02/19. Pt concerned this will delay D/C.

## 2019-04-05 NOTE — Progress Notes (Signed)
Physical Therapy Treatment Patient Details Name: Jack Huerta MRN: [id number] DOB: [birth year] Today's Date: 04/05/2019    History of Present Illness 73 y/o male s/p R THA with direct anterior approach (04/01/19)    PT Comments    Patient is making good progress. He needed some cuing to go up with the good leg and down with the bad but he was safe. Even when he went up with the surgical leg he appeared steady. Therapy reviewed home safety with him. He will likely D/C home.   Follow Up Recommendations  Follow surgeon's recommendation for DC plan and follow-up therapies     Equipment Recommendations  None recommended by PT    Recommendations for Other Services       Precautions / Restrictions Precautions Precautions: Fall;Anterior Hip Precaution Booklet Issued: Yes (comment) Restrictions Weight Bearing Restrictions: Yes RLE Weight Bearing: Weight bearing as tolerated    Mobility  Bed Mobility Overal bed mobility: Needs Assistance Bed Mobility: Supine to Sit     Supine to sit: Modified independent (Device/Increase time)     General bed mobility comments: Pt used bed rails, but would likely be able to move without these if asked. No c/o pain.  Transfers Overall transfer level: Needs assistance Equipment used: None                Ambulation/Gait Ambulation/Gait assistance: Supervision Gait Distance (Feet): 500 Feet Assistive device: Rolling walker (2 wheeled) Gait Pattern/deviations: Step-through pattern;WFL(Within Functional Limits)     General Gait Details: decreased single leg stance on right. Steady step through gait pattern with no pain    Stairs Stairs: Yes Stairs assistance: Supervision Stair Management: Two rails Number of Stairs: 18 General stair comments: Patient went up and down the steps several times. he required min cuing for proper gait pattern but he did well.    Wheelchair Mobility    Modified Rankin (Stroke Patients Only)        Balance Overall balance assessment: Needs assistance Sitting-balance support: No upper extremity supported;Feet supported Sitting balance-Leahy Scale: Normal     Standing balance support: Bilateral upper extremity supported Standing balance-Leahy Scale: Poor                              Cognition Arousal/Alertness: Awake/alert Behavior During Therapy: WFL for tasks assessed/performed Overall Cognitive Status: Within Functional Limits for tasks assessed                                 General Comments: Pt very eager to participate and receive more therapy      Exercises      General Comments        Pertinent Vitals/Pain Pain Assessment: Faces Faces Pain Scale: No hurt Pain Location: R hip Pain Descriptors / Indicators: Grimacing;Sore Pain Intervention(s): Limited activity within patient's tolerance;Monitored during session    Home Living                      Prior Function            PT Goals (current goals can now be found in the care plan section) Acute Rehab PT Goals Patient Stated Goal: to go home PT Goal Formulation: With patient Time For Goal Achievement: 04/09/19 Potential to Achieve Goals: Good Progress towards PT goals: Progressing toward goals    Frequency    7X/week  PT Plan Current plan remains appropriate    Co-evaluation              AM-PAC PT "6 Clicks" Mobility   Outcome Measure  Help needed turning from your back to your side while in a flat bed without using bedrails?: None Help needed moving from lying on your back to sitting on the side of a flat bed without using bedrails?: None Help needed moving to and from a bed to a chair (including a wheelchair)?: A Little Help needed standing up from a chair using your arms (e.g., wheelchair or bedside chair)?: A Little Help needed to walk in hospital room?: A Little Help needed climbing 3-5 steps with a railing? : A Little 6 Click Score:  20    End of Session Equipment Utilized During Treatment: Gait belt Activity Tolerance: Patient tolerated treatment well Patient left: in chair;with call bell/phone within reach   PT Visit Diagnosis: Unsteadiness on feet (R26.81);Difficulty in walking, not elsewhere classified (R26.2);Pain Pain - Right/Left: Right Pain - part of body: Hip     Time: 0840-0900 PT Time Calculation (min) (ACUTE ONLY): 20 min  Charges:  $Gait Training: 8-22 mins                       Carney Living PT  04/05/2019, 9:32 AM

## 2019-04-05 NOTE — Discharge Summary (Signed)
Patient ID: Jack Huerta MRN: [id number] DOB/AGE: 11/27/1945 73 y.o.  Admit date: 04/01/2019 Discharge date: 04/05/2019  Admission Diagnoses:  Principal Problem:   Unilateral primary osteoarthritis, right hip Active Problems:   Status post total replacement of right hip   Discharge Diagnoses:  Same  Past Medical History:  Diagnosis Date  . Cancer (HCC)    Melanoma (spots removed)  . Chicken pox   . False positive serological test for hepatitis C   . [place] measles   . Hemorrhoids   . Mumps   . SVT (supraventricular tachycardia) ([place]), exercise induced 1993  . Tinnitus     Surgeries: Procedure(s): RIGHT TOTAL HIP ARTHROPLASTY ANTERIOR APPROACH on 04/01/2019   Consultants:   Discharged Condition: Improved  Hospital Course: Jack Huerta is an 73 y.o. male who was admitted 04/01/2019 for operative treatment ofUnilateral primary osteoarthritis, right hip. Patient has severe unremitting pain that affects sleep, daily activities, and work/hobbies. After pre-op clearance the patient was taken to the operating room on 04/01/2019 and underwent  Procedure(s): RIGHT TOTAL HIP ARTHROPLASTY ANTERIOR APPROACH.    Patient was given perioperative antibiotics:  Anti-infectives (From admission, onward)   Start     Dose/Rate Route Frequency Ordered Stop   04/02/19 0600  ceFAZolin (ANCEF) IVPB 2g/100 mL premix     2 g 200 mL/hr over 30 Minutes Intravenous On call to O.R. 04/01/19 1226 04/01/19 1459   04/01/19 1815  ceFAZolin (ANCEF) IVPB 1 g/50 mL premix     1 g 100 mL/hr over 30 Minutes Intravenous Every 6 hours 04/01/19 1802 04/02/19 0252   04/01/19 1230  ceFAZolin (ANCEF) 2-4 GM/100ML-% IVPB    Note to Pharmacy: Gustavo Lah   : cabinet override      04/01/19 1230 04/01/19 1459       Patient was given sequential compression devices, early ambulation, and chemoprophylaxis to prevent DVT.  Patient benefited maximally from hospital stay and there were no complications.     Recent vital signs:  Patient Vitals for the past 24 hrs:  BP Temp Temp src Pulse Resp SpO2  04/05/19 0320 (!) 98/50 98.7 F (37.1 C) - 65 18 99 %  04/04/19 2122 114/66 97.8 F (36.6 C) - 68 19 98 %  04/04/19 1207 115/71 98 F (36.7 C) Oral 69 18 100 %  04/04/19 1032 (!) 101/54 - - 61 - -     Recent laboratory studies: No results for input(s): WBC, HGB, HCT, PLT, NA, K, CL, CO2, BUN, CREATININE, GLUCOSE, INR, CALCIUM in the last 72 hours.  Invalid input(s): PT, 2   Discharge Medications:   Allergies as of 04/05/2019   No Known Allergies     Medication List    STOP taking these medications   aspirin 81 MG tablet Replaced by: aspirin 81 MG chewable tablet     TAKE these medications   aspirin 81 MG chewable tablet Chew 1 tablet (81 mg total) by mouth 2 (two) times daily. Replaces: aspirin 81 MG tablet   atenolol 25 MG tablet Commonly known as: TENORMIN TAKE 1 TABLET BY MOUTH ONCE A DAY   Biotin 5 MG Caps Take 5 mg by mouth daily.   celecoxib 200 MG capsule Commonly known as: CeleBREX Take 1 capsule (200 mg total) by mouth 2 (two) times daily between meals as needed.   HYDROcodone-acetaminophen 7.5-325 MG tablet Commonly known as: NORCO Take 1-2 tablets by mouth every 6 (six) hours as needed for severe pain.   Magnesium 250 MG Tabs  Take 250 mg by mouth daily.   methocarbamol 500 MG tablet Commonly known as: ROBAXIN Take 1 tablet (500 mg total) by mouth every 6 (six) hours as needed for muscle spasms.   multivitamin with minerals tablet Take 1 tablet by mouth daily.   vitamin C 1000 MG tablet Take 1,000 mg by mouth daily.            Durable Medical Equipment  (From admission, onward)         Start     Ordered   04/05/19 0800  For home use only DME 3 n 1  Once     04/05/19 0800   04/05/19 0800  For home use only DME Walker rolling  Once    Question:  Patient needs a walker to treat with the following condition  Answer:  Status post total  replacement of right hip   04/05/19 0800   04/01/19 1803  DME 3 n 1  Once     04/01/19 1802   04/01/19 1803  DME Walker rolling  Once    Question:  Patient needs a walker to treat with the following condition  Answer:  Status post total replacement of right hip   04/01/19 1802          Diagnostic Studies: Dg Pelvis Portable  Result Date: 04/01/2019 CLINICAL DATA:  Right total hip arthroplasty EXAM: PORTABLE PELVIS 1-2 VIEWS COMPARISON:  06/03/2018 pelvic radiograph FINDINGS: Status post right total hip arthroplasty with well-positioned right acetabular and proximal right femoral prostheses. No hardware fracture. No evidence of hip dislocation on this single frontal view. Expected postoperative soft tissue gas surrounding the right hip. Vertical skin staples lateral to the right hip. No osseous fracture. No suspicious focal osseous lesions. IMPRESSION: Satisfactory immediate postoperative single frontal view appearance status post right total hip arthroplasty. Electronically Signed   By: Ilona Sorrel M.D.   On: 04/01/2019 17:11   Dg C-arm 1-60 Min  Result Date: 04/01/2019 CLINICAL DATA:  Osteoarthritis of the right hip. Status post right total hip arthroplasty. EXAM: OPERATIVE RIGHT HIP (WITH PELVIS IF PERFORMED) 1 VIEW TECHNIQUE: Fluoroscopic spot image(s) were submitted for interpretation post-operatively. COMPARISON:  radiographs dated 06/03/2018 FINDINGS: AP views of the right hip and pelvis demonstrate that the acetabular and femoral components of the right total hip prosthesis appear in excellent position in the AP projection. No fractures. IMPRESSION: Satisfactory appearance of the right total hip prosthesis. FLUOROSCOPY TIME:  17 seconds C-arm fluoroscopic images were obtained intraoperatively and submitted for post operative interpretation. Electronically Signed   By: Lorriane Shire M.D.   On: 04/01/2019 16:07   Dg Hip Operative Unilat W Or W/o Pelvis Right  Result Date:  04/01/2019 CLINICAL DATA:  Osteoarthritis of the right hip. Status post right total hip arthroplasty. EXAM: OPERATIVE RIGHT HIP (WITH PELVIS IF PERFORMED) 1 VIEW TECHNIQUE: Fluoroscopic spot image(s) were submitted for interpretation post-operatively. COMPARISON:  radiographs dated 06/03/2018 FINDINGS: AP views of the right hip and pelvis demonstrate that the acetabular and femoral components of the right total hip prosthesis appear in excellent position in the AP projection. No fractures. IMPRESSION: Satisfactory appearance of the right total hip prosthesis. FLUOROSCOPY TIME:  17 seconds C-arm fluoroscopic images were obtained intraoperatively and submitted for post operative interpretation. Electronically Signed   By: Lorriane Shire M.D.   On: 04/01/2019 16:07    Disposition: Discharge disposition: 01-Home or Self Care         Follow-up Information    Mcarthur Rossetti,  MD Follow up in 2 week(s).   Specialty: Orthopedic Surgery Why: Our new address is [address] This is right next door to our old Estate manager/land agent information: [place] [id number] [phone]        [facility] Follow up.   Specialties: [place] Why: rehab Contact information: [address] [place] [phone]           Signed: Mcarthur Rossetti 04/05/2019, 8:02 AM

## 2019-04-05 NOTE — TOC Transition Note (Signed)
Transition of Care [facility]) - CM/SW Discharge Note   Patient Details  Name: SPIRIDON GAUDREAULT MRN: [id number] Date of Birth: [birth year]  Transition of Care [facility]) CM/SW Contact:  Claudie Leach, RN Phone Number: [phone] 04/05/2019, 10:29 AM   Clinical Narrative:    Patient to d/c home today.  Patient presented with Medicare list as he has no preference for [facility] agency.  Denied by [place].  Accepted by [facility].   Patient denies need for any DME including RW and 3n1.  Patient states he does not have but does not need.     Final next level of care: [place] Barriers to Discharge: No Barriers Identified   Patient Goals and CMS Choice Patient states their goals for this hospitalization and ongoing recovery are:: "to get better to go home" CMS Medicare.gov Compare Post Acute Care list provided to:: Patient Choice offered to / list presented to : Patient   Discharge Plan and Services In-house Referral: NA Discharge Planning Services: CM Consult Post Acute Care Choice: [place]          DME Arranged: N/A DME Agency: NA       HH Arranged: PT HH Agency: [facility] Date [place] Agency Contacted: 04/05/19 Time [place]: [id number] Representative spoke with at [place]: [place]

## 2019-04-05 NOTE — Progress Notes (Signed)
Patient ID: Jack Huerta, male   DOB: 11/13/1945, 73 y.o.   MRN: [id number] The patient is doing very well with his mobility.  Due to delays in getting him approved for skilled nursing, he would rather go home.  I agree with that plan given his good mobility and pain tolerance.  Will discharge to home today instead.

## 2019-04-05 NOTE — Plan of Care (Signed)

## 2019-04-06 DIAGNOSIS — I2510 Atherosclerotic heart disease of native coronary artery without angina pectoris: Secondary | ICD-10-CM | POA: Diagnosis not present

## 2019-04-06 DIAGNOSIS — Z9181 History of falling: Secondary | ICD-10-CM | POA: Diagnosis not present

## 2019-04-06 DIAGNOSIS — Z96641 Presence of right artificial hip joint: Secondary | ICD-10-CM | POA: Diagnosis not present

## 2019-04-06 DIAGNOSIS — Z471 Aftercare following joint replacement surgery: Secondary | ICD-10-CM | POA: Diagnosis not present

## 2019-04-06 DIAGNOSIS — K5730 Diverticulosis of large intestine without perforation or abscess without bleeding: Secondary | ICD-10-CM | POA: Diagnosis not present

## 2019-04-06 DIAGNOSIS — Z79891 Long term (current) use of opiate analgesic: Secondary | ICD-10-CM | POA: Diagnosis not present

## 2019-04-06 DIAGNOSIS — H9319 Tinnitus, unspecified ear: Secondary | ICD-10-CM | POA: Diagnosis not present

## 2019-04-06 DIAGNOSIS — I471 Supraventricular tachycardia: Secondary | ICD-10-CM | POA: Diagnosis not present

## 2019-04-06 DIAGNOSIS — M5431 Sciatica, right side: Secondary | ICD-10-CM | POA: Diagnosis not present

## 2019-04-06 DIAGNOSIS — Z85828 Personal history of other malignant neoplasm of skin: Secondary | ICD-10-CM | POA: Diagnosis not present

## 2019-04-07 ENCOUNTER — Encounter: Payer: Self-pay | Admitting: Orthopaedic Surgery

## 2019-04-12 DIAGNOSIS — I2510 Atherosclerotic heart disease of native coronary artery without angina pectoris: Secondary | ICD-10-CM | POA: Diagnosis not present

## 2019-04-12 DIAGNOSIS — Z79891 Long term (current) use of opiate analgesic: Secondary | ICD-10-CM | POA: Diagnosis not present

## 2019-04-12 DIAGNOSIS — I471 Supraventricular tachycardia: Secondary | ICD-10-CM | POA: Diagnosis not present

## 2019-04-12 DIAGNOSIS — K5730 Diverticulosis of large intestine without perforation or abscess without bleeding: Secondary | ICD-10-CM | POA: Diagnosis not present

## 2019-04-12 DIAGNOSIS — Z96641 Presence of right artificial hip joint: Secondary | ICD-10-CM | POA: Diagnosis not present

## 2019-04-12 DIAGNOSIS — M5431 Sciatica, right side: Secondary | ICD-10-CM | POA: Diagnosis not present

## 2019-04-12 DIAGNOSIS — H9319 Tinnitus, unspecified ear: Secondary | ICD-10-CM | POA: Diagnosis not present

## 2019-04-12 DIAGNOSIS — Z85828 Personal history of other malignant neoplasm of skin: Secondary | ICD-10-CM | POA: Diagnosis not present

## 2019-04-12 DIAGNOSIS — Z9181 History of falling: Secondary | ICD-10-CM | POA: Diagnosis not present

## 2019-04-12 DIAGNOSIS — Z471 Aftercare following joint replacement surgery: Secondary | ICD-10-CM | POA: Diagnosis not present

## 2019-04-15 ENCOUNTER — Inpatient Hospital Stay: Payer: Medicare HMO | Admitting: Orthopaedic Surgery

## 2019-04-15 DIAGNOSIS — Z79891 Long term (current) use of opiate analgesic: Secondary | ICD-10-CM | POA: Diagnosis not present

## 2019-04-15 DIAGNOSIS — I2510 Atherosclerotic heart disease of native coronary artery without angina pectoris: Secondary | ICD-10-CM | POA: Diagnosis not present

## 2019-04-15 DIAGNOSIS — H9319 Tinnitus, unspecified ear: Secondary | ICD-10-CM | POA: Diagnosis not present

## 2019-04-15 DIAGNOSIS — K5730 Diverticulosis of large intestine without perforation or abscess without bleeding: Secondary | ICD-10-CM | POA: Diagnosis not present

## 2019-04-15 DIAGNOSIS — Z471 Aftercare following joint replacement surgery: Secondary | ICD-10-CM | POA: Diagnosis not present

## 2019-04-15 DIAGNOSIS — M5431 Sciatica, right side: Secondary | ICD-10-CM | POA: Diagnosis not present

## 2019-04-15 DIAGNOSIS — Z85828 Personal history of other malignant neoplasm of skin: Secondary | ICD-10-CM | POA: Diagnosis not present

## 2019-04-15 DIAGNOSIS — Z96641 Presence of right artificial hip joint: Secondary | ICD-10-CM | POA: Diagnosis not present

## 2019-04-15 DIAGNOSIS — I471 Supraventricular tachycardia: Secondary | ICD-10-CM | POA: Diagnosis not present

## 2019-04-15 DIAGNOSIS — Z9181 History of falling: Secondary | ICD-10-CM | POA: Diagnosis not present

## 2019-04-16 ENCOUNTER — Other Ambulatory Visit: Payer: Self-pay

## 2019-04-16 ENCOUNTER — Ambulatory Visit (INDEPENDENT_AMBULATORY_CARE_PROVIDER_SITE_OTHER): Payer: Medicare HMO | Admitting: Orthopaedic Surgery

## 2019-04-16 ENCOUNTER — Encounter: Payer: Self-pay | Admitting: Orthopaedic Surgery

## 2019-04-16 DIAGNOSIS — Z96641 Presence of right artificial hip joint: Secondary | ICD-10-CM

## 2019-04-16 NOTE — Progress Notes (Signed)
The patient is 15 days status post a right total hip arthroplasty.  He said he is doing well and has good strength and good range of motion.  He is 73 years old.  He is already walking without assistive device.  He has been on aspirin twice a day.  He is not taking any pain medications other than occasional Celebrex.  On exam he gets up on exam table easily.  His incisions healed nicely on the right side.  His leg lengths are equal.  I remove the staples in place Steri-Strips.  He does have a moderate seroma and I did drain 70 cc of fluid from the soft tissue.  He tolerated this well.  He understands that may reaccumulate but I would just have him apply heat with a heating pad about 30 minutes a day once or twice daily to that hip area.  All question concerns were answered and addressed.  We will see him back in 4 weeks to see how he is doing overall but no x-rays are needed.  He will go back to his normal once a day aspirin.

## 2019-05-14 ENCOUNTER — Encounter: Payer: Self-pay | Admitting: Orthopaedic Surgery

## 2019-05-14 ENCOUNTER — Ambulatory Visit (INDEPENDENT_AMBULATORY_CARE_PROVIDER_SITE_OTHER): Payer: Medicare HMO | Admitting: Orthopaedic Surgery

## 2019-05-14 ENCOUNTER — Other Ambulatory Visit: Payer: Self-pay

## 2019-05-14 DIAGNOSIS — Z96641 Presence of right artificial hip joint: Secondary | ICD-10-CM

## 2019-05-14 NOTE — Progress Notes (Signed)
The patient is 6 weeks status post a right total hip arthroplasty.  He is a very active 73 years old.  He says his range of motion and strength are doing well and he is very pleased with his outcome thus far.  He says he is surprised that he is doing well.  On exam he is not walking with an assistive device.  He gets up out of a chair easily.  There is still slight groin pain I told him as to be expected.  His range of motion of his right hip is full and it feels just like his left native hip.  His leg lengths are equal.  He is walking without a limp.  At this point he is doing so well we do not need to see him back for 6 months.  If there is any issues before then he will let us know.  At his next follow-up visit I would like a standing low AP pelvis and lateral of his right operative hip.  All question concerns were answered and addressed.

## 2019-06-10 ENCOUNTER — Other Ambulatory Visit: Payer: Self-pay | Admitting: *Deleted

## 2019-06-10 MED ORDER — ATENOLOL 25 MG PO TABS: ORAL_TABLET | ORAL | 1 refills | Status: AC

## 2019-06-19 ENCOUNTER — Ambulatory Visit: Payer: Medicare HMO

## 2019-06-26 ENCOUNTER — Ambulatory Visit: Payer: Medicare HMO | Attending: Internal Medicine

## 2019-06-26 DIAGNOSIS — Z23 Encounter for immunization: Secondary | ICD-10-CM

## 2019-06-26 NOTE — Progress Notes (Signed)
   Covid-19 Vaccination Clinic  Name:  Jack Huerta    MRN: [id number] DOB: 01-20-1946  06/26/2019  [name] was observed post Covid-19 immunization for 15 minutes without incidence. He was provided with Vaccine Information Sheet and instruction to access the V-Safe system.   [name] was instructed to call 911 with any severe reactions post vaccine: Marland Kitchen Difficulty breathing  . Swelling of your face and throat  . A fast heartbeat  . A bad rash all over your body  . Dizziness and weakness    Immunizations Administered    Name Date Dose VIS Date Route   Pfizer COVID-19 Vaccine 06/26/2019 10:14 AM 0.3 mL 05/02/2019 Intramuscular   Manufacturer: [place]   Lot: [id number]   [place]: [id number]

## 2019-07-22 ENCOUNTER — Ambulatory Visit: Payer: Medicare HMO | Attending: Internal Medicine

## 2019-07-22 DIAGNOSIS — Z23 Encounter for immunization: Secondary | ICD-10-CM

## 2019-07-22 NOTE — Progress Notes (Signed)
   Covid-19 Vaccination Clinic  Name:  LYRIX WINGERD    MRN: [id number] DOB: 12-17-1945  07/22/2019  Mr. Aderman was observed post Covid-19 immunization for 15 minutes without incident. He was provided with Vaccine Information Sheet and instruction to access the V-Safe system.   Mr. Dutkiewicz was instructed to call 911 with any severe reactions post vaccine: Marland Kitchen Difficulty breathing  . Swelling of face and throat  . A fast heartbeat  . A bad rash all over body  . Dizziness and weakness   Immunizations Administered    Name Date Dose VIS Date Route   Pfizer COVID-19 Vaccine 07/22/2019  8:42 AM 0.3 mL 05/02/2019 Intramuscular   Manufacturer: [place]   Lot: [id number]   [place]: [id number]

## 2019-08-04 ENCOUNTER — Ambulatory Visit: Payer: Medicare HMO | Admitting: Cardiology

## 2019-08-04 ENCOUNTER — Other Ambulatory Visit: Payer: Self-pay

## 2019-08-04 ENCOUNTER — Encounter: Payer: Self-pay | Admitting: Cardiology

## 2019-08-04 VITALS — BP 96/70 | HR 50 | Ht 69.0 in | Wt 139.1 lb

## 2019-08-04 DIAGNOSIS — I471 Supraventricular tachycardia: Secondary | ICD-10-CM

## 2019-08-04 DIAGNOSIS — R001 Bradycardia, unspecified: Secondary | ICD-10-CM | POA: Diagnosis not present

## 2019-08-04 NOTE — Progress Notes (Signed)
Cardiology Office Note:    Date:  08/04/2019   ID:  Jack Huerta, DOB 1946-04-16, MRN [id number]  PCP:  Patient, No Pcp Per  Cardiologist:  Candee Furbish, MD  Electrophysiologist:  None   Referring MD: No ref. provider found     History of Present Illness:    Jack Huerta is a 74 y.o. male here for follow-up of paroxysmal SVT.  Prior Holter monitor showed frequent PACs 16,000.  EF was normal with mild mitral vegetation.  Prior exercise treadmill test in 2013 ran for 12 minutes but immediately after he stopped he had a brief episode of SVT with heart rate of 180.  Previously had leg pain, underwent hip surgery.. Moving to [facility] to a smaller house closer to his son's over the past 3 months.  Seems to still be doing quite well.  Will rarely feel palpitations when sitting on the couch for instance.  They are short-lived.  When he was going to the gym, exercising vigorously he would not have his heart rate increased to the 180s like it did in the past.  His blood pressure has been somewhat on the low side.  He does not restrict his sodium.  He does not feel any excessive dizziness when standing no orthostatics.  Past Medical History:  Diagnosis Date  . Cancer (HCC)    Melanoma (spots removed)  . Chicken pox   . False positive serological test for hepatitis C   . [place] measles   . Hemorrhoids   . Mumps   . SVT (supraventricular tachycardia) ([place]), exercise induced 1993  . Tinnitus     Past Surgical History:  Procedure Laterality Date  . APPENDECTOMY  1958  . INGUINAL HERNIA REPAIR Left 04/2010  . INGUINAL HERNIA REPAIR Right 04/2001  . INGUINAL HERNIA REPAIR Left 1973  . TONSILLECTOMY  1962  . TOTAL HIP ARTHROPLASTY Right 04/01/2019   Procedure: RIGHT TOTAL HIP ARTHROPLASTY ANTERIOR APPROACH;  Surgeon: Mcarthur Rossetti, MD;  Location: [place];  Service: Orthopedics;  Laterality: Right;    Current Medications: Current Meds  Medication Sig  . Ascorbic Acid  (VITAMIN C) 1000 MG tablet Take 1,000 mg by mouth daily.  Marland Kitchen aspirin EC 81 MG tablet Take 81 mg by mouth daily.  Marland Kitchen atenolol (TENORMIN) 25 MG tablet TAKE 1 TABLET BY MOUTH ONCE A DAY  . Biotin 5 MG CAPS Take 5 mg by mouth daily.  . Magnesium 250 MG TABS Take 250 mg by mouth daily.  . Multiple Vitamins-Minerals (MULTIVITAMIN WITH MINERALS) tablet Take 1 tablet by mouth daily.     Allergies:   Patient has no known allergies.   Social History   Socioeconomic History  . Marital status: Divorced    Spouse name: Not on file  . Number of children: Not on file  . Years of education: Not on file  . Highest education level: Not on file  Occupational History    Employer: RETIRED  Tobacco Use  . Smoking status: Never Smoker  . Smokeless tobacco: Never Used  Substance and Sexual Activity  . Alcohol use: No  . Drug use: No  . Sexual activity: Not on file  Other Topics Concern  . Not on file  Social History Narrative  . Not on file   Social Determinants of Health   Financial Resource Strain:   . Difficulty of Paying Living Expenses:   Food Insecurity:   . Worried About Charity fundraiser in the Last Year:   .  Ran Out of Food in the Last Year:   Transportation Needs:   . Film/video editor (Medical):   Marland Kitchen Lack of Transportation (Non-Medical):   Physical Activity:   . Days of Exercise per Week:   . Minutes of Exercise per Session:   Stress:   . Feeling of Stress :   Social Connections:   . Frequency of Communication with Friends and Family:   . Frequency of Social Gatherings with Friends and Family:   . Attends Religious Services:   . Active Member of Clubs or Organizations:   . Attends Archivist Meetings:   Marland Kitchen Marital Status:      Family History: The patient's family history includes Aneurysm in his father; Arthritis/Rheumatoid in his mother; Chronic fatigue in his brother; Leukemia in his sister; Prostate cancer in his father. There is no history of Colon cancer.   ROS:   Please see the history of present illness.    No fever chills nausea vomiting syncope bleeding all other systems reviewed and are negative.  EKGs/Labs/Other Studies Reviewed:    The following studies were reviewed today: As above.  EKG:  EKG is not ordered today.  Prior reviewed from 03/26/2019 shows sinus bradycardia 49 with incomplete right bundle branch block nonspecific ST-T wave changes, no change from prior.  Recent Labs: 04/02/2019: BUN 13; Creatinine, Ser 0.84; Hemoglobin 12.9; Platelets 159; Potassium 3.6; Sodium 134  Recent Lipid Panel    Component Value Date/Time   CHOL 182 04/29/2018 1439   TRIG 41.0 04/29/2018 1439   HDL 77.90 04/29/2018 1439   CHOLHDL 2 04/29/2018 1439   VLDL 8.2 04/29/2018 1439   LDLCALC 96 04/29/2018 1439    Physical Exam:    VS:  BP 96/70   Pulse (!) 50   Ht 5\' 9"  (1.753 m)   Wt 139 lb 1.9 oz (63.1 kg)   SpO2 98%   BMI 20.54 kg/m     Wt Readings from Last 3 Encounters:  08/04/19 139 lb 1.9 oz (63.1 kg)  04/01/19 144 lb (65.3 kg)  03/26/19 141 lb 8 oz (64.2 kg)     GEN: Thin in no acute distress HEENT: Normal NECK: No JVD; No carotid bruits LYMPHATICS: No lymphadenopathy CARDIAC: RRR, no murmurs, rubs, gallops RESPIRATORY:  Clear to auscultation without rales, wheezing or rhonchi  ABDOMEN: Soft, non-tender, non-distended MUSCULOSKELETAL:  No edema; No deformity  SKIN: Warm and dry NEUROLOGIC:  Alert and oriented x 3 PSYCHIATRIC:  Normal affect   ASSESSMENT:    1. PSVT (paroxysmal supraventricular tachycardia) ([place])   2. Bradycardia    PLAN:    In order of problems listed above:  PSVT -Continue with atenolol.  Doing well with this.  Heart rate increases up to approximately 120 on treadmill.  If we increased his atenolol, his bradycardia was more apparent.  Bradycardia -Heart rates have been 49, 50.  Asymptomatic with this.  Continue with atenolol 25 mg for suppression of his PSVT.  This seems to be working well.   This was decreased previously.  No changes made.  Prevention -Previously discussed calcium score.  He would rather continue with current conservative prevention efforts.  He is moving to [place].  He did state that he will try to maintain our visits here.   Medication Adjustments/Labs and Tests Ordered: Current medicines are reviewed at length with the patient today.  Concerns regarding medicines are outlined above.  No orders of the defined types were placed in this encounter.  No orders  of the defined types were placed in this encounter.   Patient Instructions  Medication Instructions:   Your physician recommends that you continue on your current medications as directed. Please refer to the Current Medication list given to you today.  *If you need a refill on your cardiac medications before your next appointment, please call your pharmacy*  Follow-Up: At [facility], you and your health needs are our priority.  As part of our continuing mission to provide you with exceptional heart care, we have created designated Provider Care Teams.  These Care Teams include your primary Cardiologist (physician) and Advanced Practice Providers (APPs -  Physician Assistants and Nurse Practitioners) who all work together to provide you with the care you need, when you need it.  We recommend signing up for the patient portal called "MyChart".  Sign up information is provided on this After Visit Summary.  MyChart is used to connect with patients for Virtual Visits (Telemedicine).  Patients are able to view lab/test results, encounter notes, upcoming appointments, etc.  Non-urgent messages can be sent to your provider as well.   To learn more about what you can do with MyChart, go to [email or web address].    Your next appointment:   12 month(s)  The format for your next appointment:   In Person  Provider:   Candee Furbish, MD        Signed, Candee Furbish, MD  08/04/2019 10:19 AM    [place]

## 2019-08-04 NOTE — Patient Instructions (Signed)
Medication Instructions:   Your physician recommends that you continue on your current medications as directed. Please refer to the Current Medication list given to you today.  *If you need a refill on your cardiac medications before your next appointment, please call your pharmacy*   Follow-Up: At [facility], you and your health needs are our priority.  As part of our continuing mission to provide you with exceptional heart care, we have created designated Provider Care Teams.  These Care Teams include your primary Cardiologist (physician) and Advanced Practice Providers (APPs -  Physician Assistants and Nurse Practitioners) who all work together to provide you with the care you need, when you need it.  We recommend signing up for the patient portal called "MyChart".  Sign up information is provided on this After Visit Summary.  MyChart is used to connect with patients for Virtual Visits (Telemedicine).  Patients are able to view lab/test results, encounter notes, upcoming appointments, etc.  Non-urgent messages can be sent to your provider as well.   To learn more about what you can do with MyChart, go to [email or web address].com.    Your next appointment:   12 month(s)  The format for your next appointment:   In Person  Provider:   Mark Skains, MD    

## 2019-09-08 ENCOUNTER — Ambulatory Visit: Payer: Medicare HMO | Admitting: Cardiology

## 2019-11-04 DIAGNOSIS — R002 Palpitations: Secondary | ICD-10-CM

## 2019-11-07 ENCOUNTER — Telehealth: Payer: Self-pay | Admitting: Interventional Cardiology

## 2019-11-07 NOTE — Telephone Encounter (Signed)
Follow up   Pt calling back, he wants to speak with Dr. Marlou Porch nurse, he said he has another question

## 2019-11-07 NOTE — Telephone Encounter (Signed)
Follow Up:    Pt said he had talked to a nurse via [place] on6-16-21.Marland Kitchen He said he waiting to hear something.

## 2019-11-07 NOTE — Telephone Encounter (Signed)
Pt continues to have palpitations see my chart note .Will forward to Dr Marlou Porch for review .Adonis Housekeeper

## 2019-11-07 NOTE — Telephone Encounter (Signed)
Per pt also wanted to let Dr Marlou Porch know has just recently started back exercising since April when gyms opened back up and per pt is gradually increasing cardio and resistance training Pt will continue to monitor Also is going to increase K rich foods to diet . Will forward to Dr Marlou Porch for review .Adonis Housekeeper

## 2019-11-07 NOTE — Telephone Encounter (Signed)
Was go ahead and set him up with a Zio patch monitor for palpitations.  I do not feel comfortable increasing the atenolol because of resting bradycardia, prior heart rate was 49 bpm.  Lets also have him come in for a basic metabolic profile.  Candee Furbish, MD

## 2019-11-08 NOTE — Telephone Encounter (Signed)
Sounds good. Thanks Candee Furbish, MD

## 2019-11-10 NOTE — Telephone Encounter (Signed)
Placed orders for BMET and for 14 day Zio monitor, not live, for palpitations. I called the patient and informed of this. He will come Wed 6/23 for lab work. And will await the monitor to come in the mail. Grateful for the phone call.

## 2019-11-11 ENCOUNTER — Telehealth: Payer: Self-pay | Admitting: Radiology

## 2019-11-11 NOTE — Telephone Encounter (Signed)
Enrolled patient for a 14 day Zio monitor to be mailed to patients home.  

## 2019-11-12 ENCOUNTER — Other Ambulatory Visit: Payer: Medicare Other | Admitting: *Deleted

## 2019-11-12 ENCOUNTER — Ambulatory Visit: Payer: Self-pay

## 2019-11-12 ENCOUNTER — Other Ambulatory Visit: Payer: Self-pay

## 2019-11-12 ENCOUNTER — Telehealth: Payer: Self-pay

## 2019-11-12 ENCOUNTER — Ambulatory Visit: Payer: Medicare Other | Admitting: Orthopaedic Surgery

## 2019-11-12 ENCOUNTER — Encounter: Payer: Self-pay | Admitting: Orthopaedic Surgery

## 2019-11-12 DIAGNOSIS — Z96641 Presence of right artificial hip joint: Secondary | ICD-10-CM | POA: Diagnosis not present

## 2019-11-12 DIAGNOSIS — R002 Palpitations: Secondary | ICD-10-CM

## 2019-11-12 LAB — BASIC METABOLIC PANEL
BUN/Creatinine Ratio: 26 — ABNORMAL HIGH (ref 10–24)
BUN: 24 mg/dL (ref 8–27)
CO2: 26 mmol/L (ref 20–29)
Calcium: 9.5 mg/dL (ref 8.6–10.2)
Chloride: 100 mmol/L (ref 96–106)
Creatinine, Ser: 0.93 mg/dL (ref 0.76–1.27)
GFR calc Af Amer: 93 mL/min/{1.73_m2} (ref >59)
GFR calc non Af Amer: 80 mL/min/{1.73_m2} (ref >59)
Glucose: 87 mg/dL (ref 65–99)
Potassium: 4.1 mmol/L (ref 3.5–5.2)
Sodium: 139 mmol/L (ref 134–144)

## 2019-11-12 NOTE — Telephone Encounter (Signed)
Pt walked in and Keitha Butte RN spoke with pt see other documentation ./cy

## 2019-11-12 NOTE — Telephone Encounter (Signed)
Pt walked in to office to request clarification of Dr Marlou Porch MyChart message 11/10/2019.  Sunday Shams E "Gene" to Jerline Pain, MD     7:39 AM This morning, June 21 at 5 am, I was listening to my heart with stethoscope and had skips or palpitations every 4 to 14 beats. I did not count my heart rate. At 7:05 am, I was feeling light headed so I checked my heart rate and it was 54 but there were no skips or palpitations for the two minutes that I was counting.  Just an update  ____________________________________________________________________________________________________   Dennis Bast were likely experiencing premature atrial contractions-benign.   Keep up the good work with you exercise. Stay hydrated.   Candee Furbish MD  _____________________________________________________________________________________________________  Pt denies CP or SOB and reports in 2013 he was diagnosed with palpitations and placed on Atenolol.  Pt reports since that time his Atenolol has been decreased and has had no problem.  Pt reports He feels many palpitations at night when lying down.  Pt has been enrolled for monitor to evaluate.  Pt advised it will be important to wear monitor to obtain information to develop new treatment plan if needed.  Continue current medications and follow up as scheduled.  Pt verbalized understanding and agrees with current plan.

## 2019-11-12 NOTE — Progress Notes (Signed)
The patient is a 74 year old gentleman who is now 7 months status post a right direct anterior total hip arthroplasty.  He is doing well overall.  He reports just some stiffness of the right hip but overall has no complaints.  He is walking with no limp and without any assistive device.  He says is much better than what he was dealing with in terms of his preoperative pain.  On exam his right hip moves fluidly and smoothly with no blocks to rotation all.  His leg lengths are equal.  He has excellent strength in his hip flexors and abductors.  An AP pelvis lateral the right hip shows a well-seated total hip arthroplasty with no complicating features.  There is only slight superior lateral narrowing of his left hip.  He is asymptomatic of the left hip.  At this point all questions and concerns were answered and addressed.  He understands the follow-up can be as needed and if he develops any issues with the right hip or other musculoskeletal complaints he will let us know.

## 2019-11-13 ENCOUNTER — Other Ambulatory Visit (INDEPENDENT_AMBULATORY_CARE_PROVIDER_SITE_OTHER): Payer: Medicare Other

## 2019-11-13 DIAGNOSIS — R002 Palpitations: Secondary | ICD-10-CM | POA: Diagnosis not present

## 2020-08-13 IMAGING — MR MRI LUMBAR SPINE WITHOUT CONTRAST
4 of 5 series · 28 of 48 positions shown · non-contrast
Comparison: 04/05/2013

CLINICAL DATA: Spinal stenosis. Low back pain radiating down the
right leg for 18 months

EXAM:
MRI LUMBAR SPINE WITHOUT CONTRAST
TECHNIQUE: Multiplanar, multisequence MR imaging of the lumbar spine was
performed. No intravenous contrast was administered.

[Series 2: T2 · sagittal · 4.0mm · 1.09mm/px · 6 of 20 slices shown (1 of 2)]
[im 1/20]
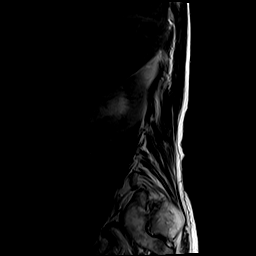
[im 4/20]
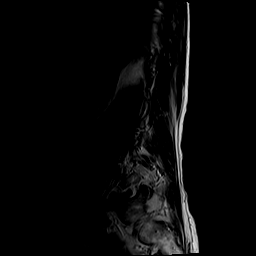
[im 8/20]
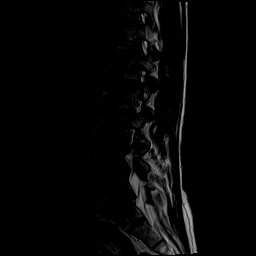
[im 12/20]
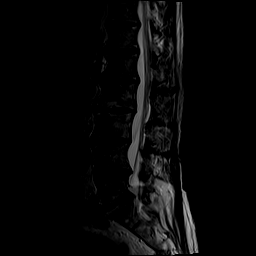
[im 16/20]
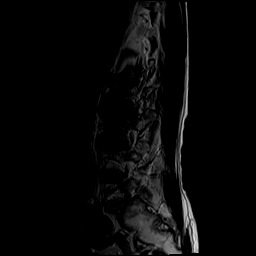
[im 20/20]
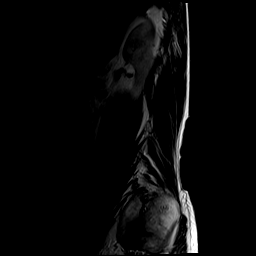

[Series 4: T1 · sagittal · 4.0mm · 1.09mm/px · 7 of 20 slices shown (1 of 2)]
[im 1/20]
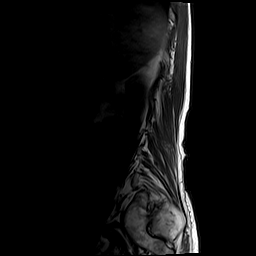
[im 4/20]
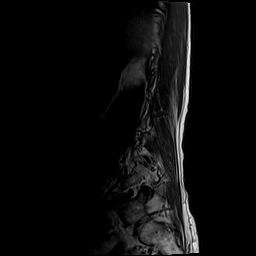
[im 7/20]
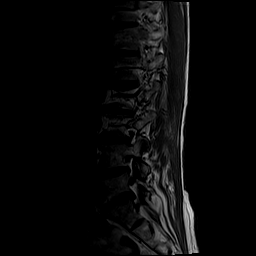
[im 10/20]
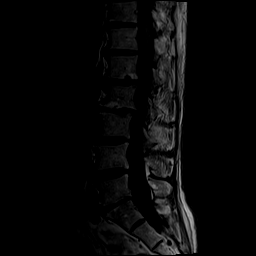
[im 13/20]
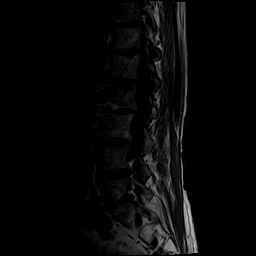
[im 16/20]
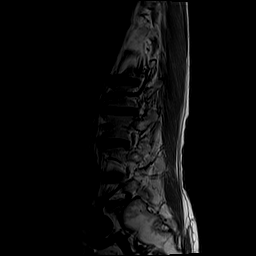
[im 20/20]
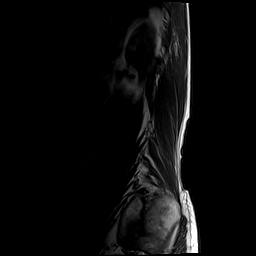

[Series 5: T2 · axial · 4.0mm · 0.39mm/px · z∈[-27,+181]mm · 8 of 40 slices shown (2 of 2)]
[im 1/40]
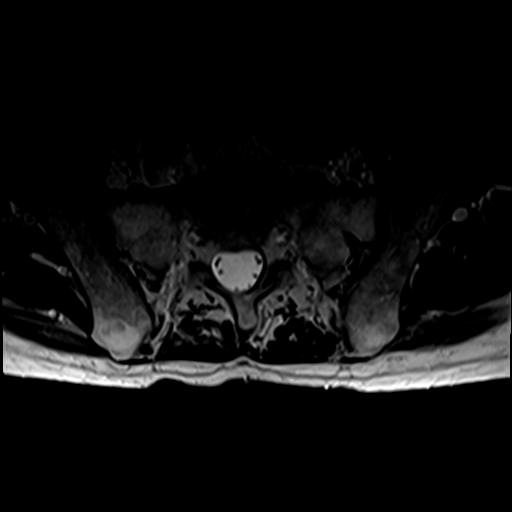
[im 7/40]
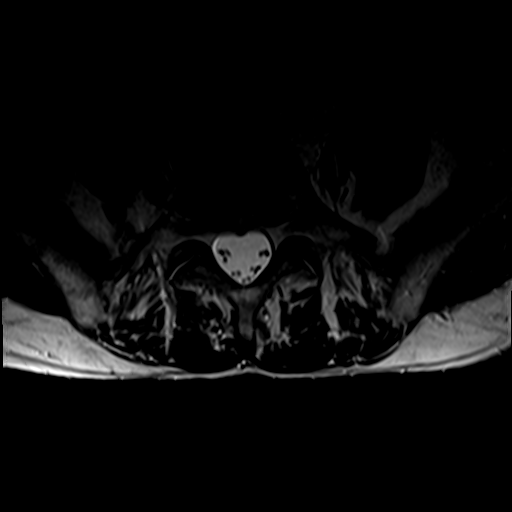
[im 13/40]
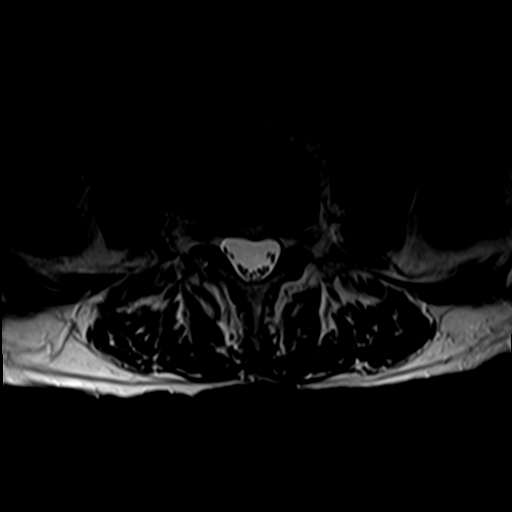
[im 19/40]
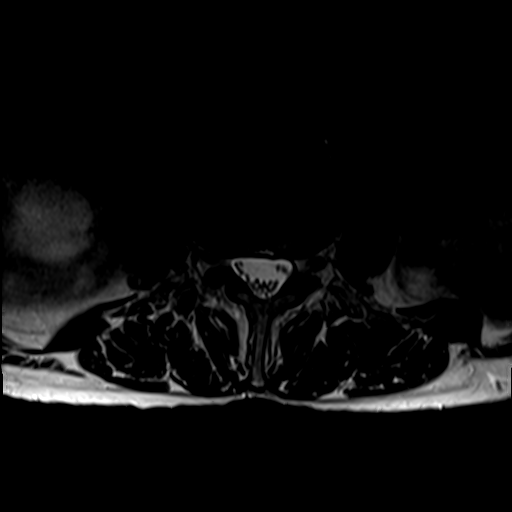
[im 22/40]
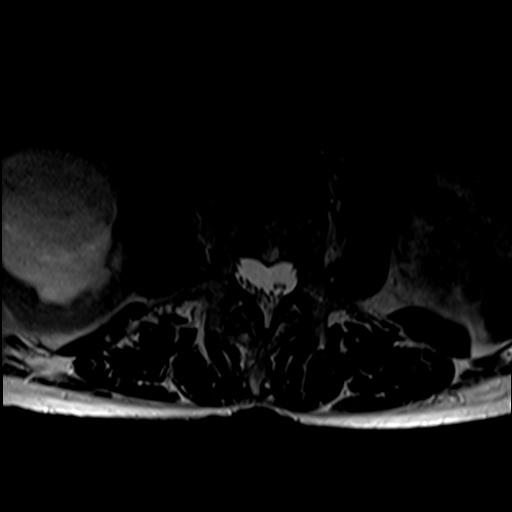
[im 28/40]
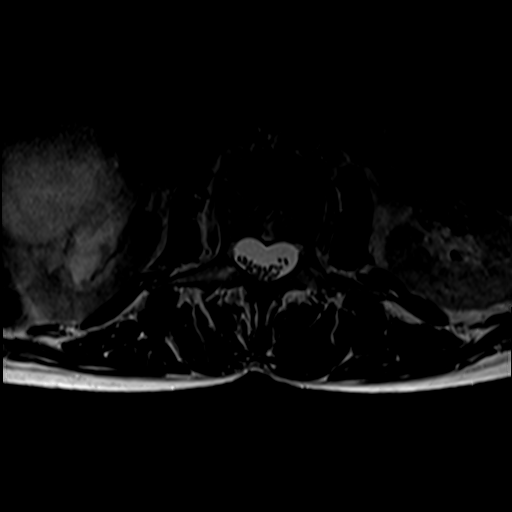
[im 34/40]
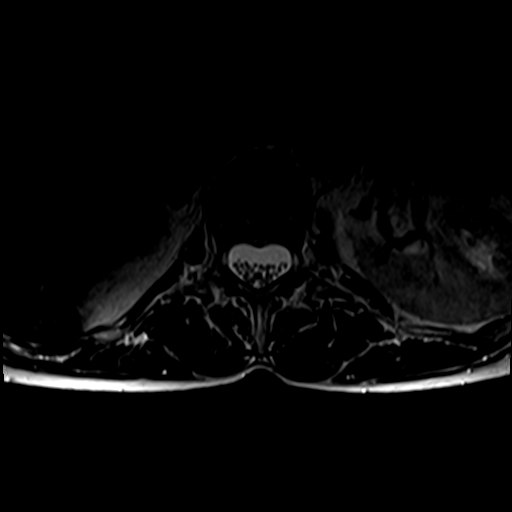
[im 40/40]
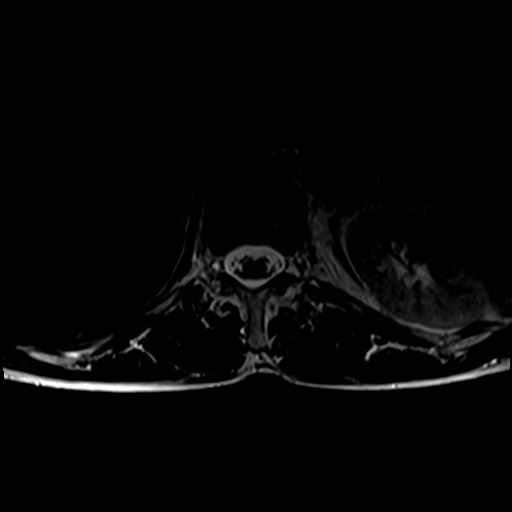

[Series 6: T1 · axial · 4.0mm · 0.39mm/px · z∈[-27,+152]mm · 7 of 40 slices shown (2 of 2)]
[im 1/40]
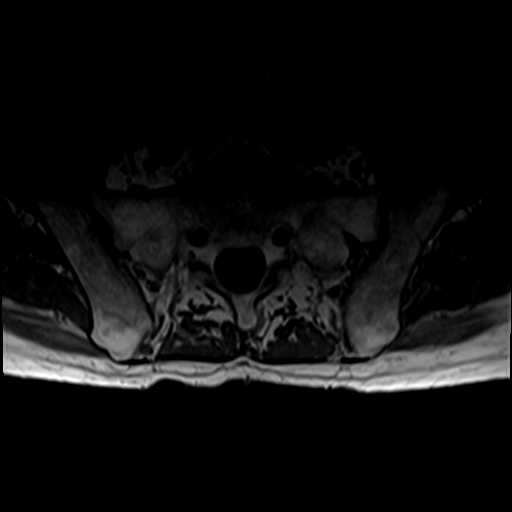
[im 7/40]
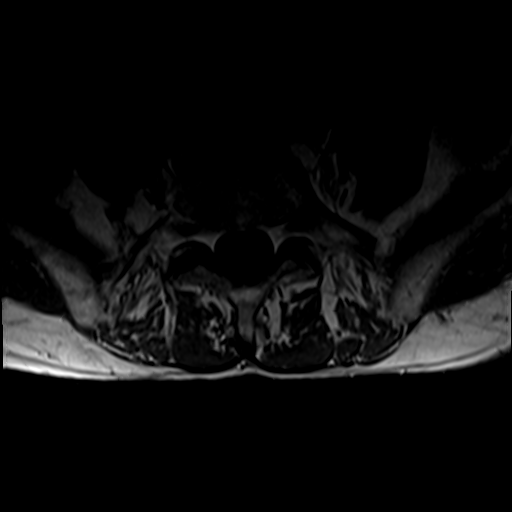
[im 13/40]
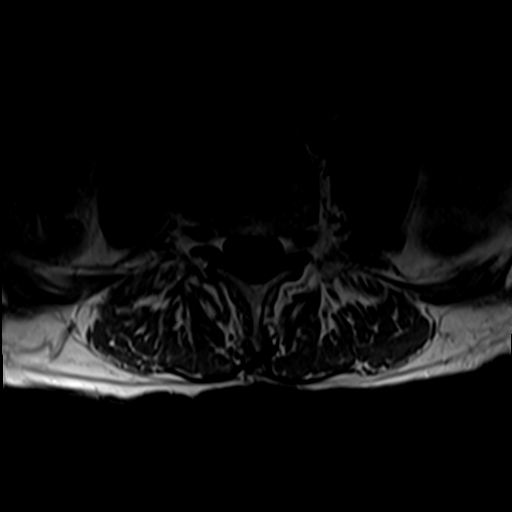
[im 19/40]
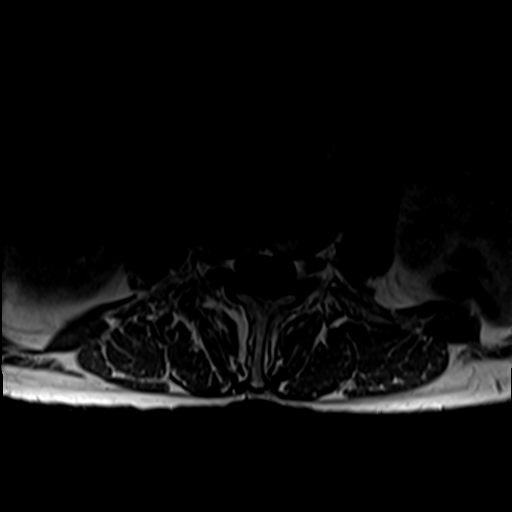
[im 22/40]
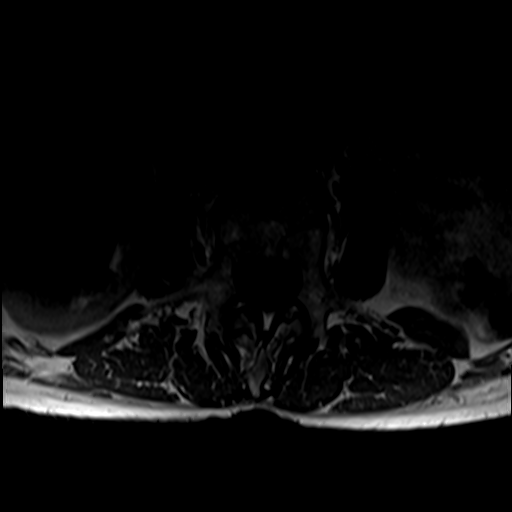
[im 28/40]
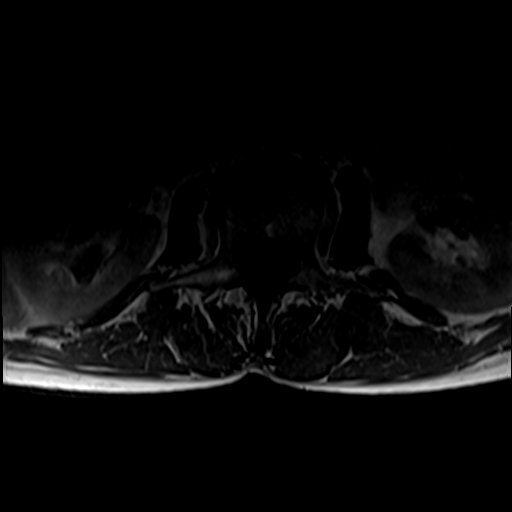
[im 34/40]
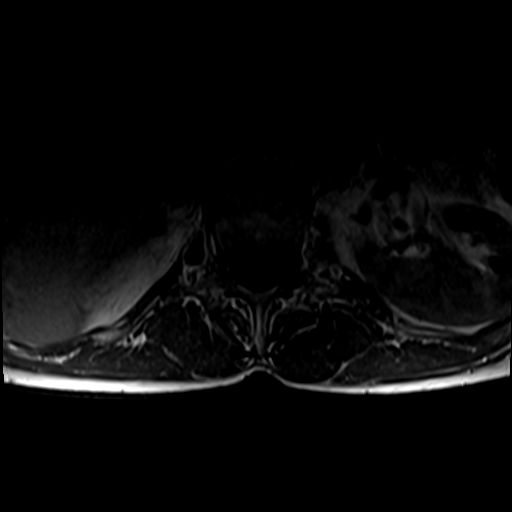

[28 of 48 positions shown; findings below may reference images not displayed]

FINDINGS: Segmentation:  Standard lumbar numbering

Alignment:  Mild levoscoliosis.  Slight retrolisthesis at L4-5.

Vertebrae: Chronic L2 inferior endplate Schmorl's node with mild
marrow edema. Interval L2 superior endplate Schmorl's node with mild
marrow edema. New minor marrow edema about the T11-12, T12-L1, and
L3 superior endplates. No fracture, discitis, or aggressive bone
lesion.

Conus medullaris and cauda equina: Conus extends to the L1 level.
Conus and cauda equina appear normal.

Paraspinal and other soft tissues: Large right renal cystic
intensity which was also seen on prior. There are smaller left renal
cystic intensities.

Disc levels:

L1-L2: Disc narrowing and bulging with mild endplate ridging. No
impingement

L2-L3: Moderate disc narrowing with endplate degeneration,
essentially stable. No impingement

L3-L4: Mild disc narrowing and desiccation with small endplate
ridges. No impingement

L4-L5: Mild disc narrowing and desiccation with posterior annular
fissure and mild endplate ridging.

L5-S1:Greatest level of degenerative disc narrowing with endplate
ridging. Negative facets. No impingement
IMPRESSION: Generalized disc degeneration with mild discogenic endplate edema at
the upper lumbar levels. The discogenic edema has of all since 4810
but degree of disc narrowing and ridging is essentially stable. No
neural compression.

## 2020-08-24 ENCOUNTER — Ambulatory Visit: Payer: Medicare Other | Admitting: Orthopaedic Surgery

## 2020-08-24 ENCOUNTER — Encounter: Payer: Self-pay | Admitting: Cardiology

## 2020-08-24 ENCOUNTER — Ambulatory Visit: Payer: Medicare Other | Admitting: Cardiology

## 2020-08-24 ENCOUNTER — Other Ambulatory Visit: Payer: Self-pay

## 2020-08-24 VITALS — BP 110/80 | HR 43 | Ht 69.0 in | Wt 145.0 lb

## 2020-08-24 DIAGNOSIS — I471 Supraventricular tachycardia: Secondary | ICD-10-CM | POA: Diagnosis not present

## 2020-08-24 DIAGNOSIS — I700 Atherosclerosis of aorta: Secondary | ICD-10-CM | POA: Diagnosis not present

## 2020-08-24 NOTE — Patient Instructions (Signed)
Medication Instructions:   Your physician recommends that you continue on your current medications as directed. Please refer to the Current Medication list given to you today.  *If you need a refill on your cardiac medications before your next appointment, please call your pharmacy*   You have been referred to SEE AN ELECTROPHYSIOLOGIST MD IN OUR OFFICE FOR PSVT--PATIENT WOULD LIKE THIS APPOINTMENT DONE SAME DAY AS HE HAS HIS CALCIUM SCORE DONE IN OUR OFFICE, PER DR. Marlou Porch   Testing/Procedures:  CARDIAC CALCIUM SCORE (SELF PAY) DONE HERE IN OUR OFFICE--PT WOULD LIKE THIS APPOINTMENT TO BE SAME DAY AS HE COMES TO SEE EP FOR CONSULT PER DR. Marlou Porch   Follow-Up: At [facility], you and your health needs are our priority.  As part of our continuing mission to provide you with exceptional heart care, we have created designated Provider Care Teams.  These Care Teams include your primary Cardiologist (physician) and Advanced Practice Providers (APPs -  Physician Assistants and Nurse Practitioners) who all work together to provide you with the care you need, when you need it.  We recommend signing up for the patient portal called "MyChart".  Sign up information is provided on this After Visit Summary.  MyChart is used to connect with patients for Virtual Visits (Telemedicine).  Patients are able to view lab/test results, encounter notes, upcoming appointments, etc.  Non-urgent messages can be sent to your provider as well.   To learn more about what you can do with MyChart, go to [email or web address].    Your next appointment:   1 year(s)  The format for your next appointment:   In Person  Provider:   Candee Furbish, MD

## 2020-08-24 NOTE — Progress Notes (Signed)
Cardiology Office Note:    Date:  08/24/2020   ID:  Jack Huerta, DOB [birth year], MRN [id number]  PCP:  Zeb Comfort, MD   [place]  Cardiologist:  Candee Furbish, MD  Advanced Practice Provider:  No care team member to display Electrophysiologist:  None       Referring MD: No ref. provider found     History of Present Illness:    Jack Huerta is a 75 y.o. male here for follow-up of paroxysmal SVT.  Prior Holter monitor showed frequent PACs 16,000.  EF normal with mild mitral regurgitation.  Stress test 2013 ran for 12 minutes but immediately after he stopped had a brief episode of SVT with heart rate of 180.  Moved to [place] to be closer to his sons.  Blood pressure has often been on the lower side.  He does not restrict his sodium.  No dizziness when standing.   He did state the following in a patient message:   1. I still have palpitations but not like in the period of June to December 2021.  2. On April 21, 2020, I had blood tests and on June 16, 2020, I had three ultrasounds that I would like to discuss  3. Also, I would like to discuss the following: a. 81 mg aspirin usage b. should I be taking statins c. from the lipid blood test, the risk calculator results state that heart disease or stroke in the next 10 years is 21.7%  Most recent event monitor was 12/10/2019:    Sinus rhythm with frequent PAC's  No atrial fibrillation  No pauses  Occasional episodes of SVT (45 in total over 14 days), longest 17 seconds duration, paroxysmal atrial tachycardia (PAT), brief episodes.   Symptoms are associated with PAC's Candee Furbish, MD  Had pulled pack atenolol from 50 to 25 with SB in the 50's.  His heart and ECG today is in the 40s.  Back last July and August had several episodes of SVT, these were bothersome to him.  They have improved.  Still has them however.  Still able to increase heart rate during cardio, exercise.    Edema of LE - over years.   Past Medical History:  Diagnosis Date  . Cancer (HCC)    Melanoma (spots removed)  . Chicken pox   . False positive serological test for hepatitis C   . [place] measles   . Hemorrhoids   . Mumps   . SVT (supraventricular tachycardia) ([place]), exercise induced 1993  . Tinnitus     Past Surgical History:  Procedure Laterality Date  . APPENDECTOMY  1958  . INGUINAL HERNIA REPAIR Left 04/2010  . INGUINAL HERNIA REPAIR Right 04/2001  . INGUINAL HERNIA REPAIR Left 1973  . TONSILLECTOMY  1962  . TOTAL HIP ARTHROPLASTY Right 04/01/2019   Procedure: RIGHT TOTAL HIP ARTHROPLASTY ANTERIOR APPROACH;  Surgeon: Mcarthur Rossetti, MD;  Location: [facility];  Service: Orthopedics;  Laterality: Right;    Current Medications: Current Meds  Medication Sig  . Ascorbic Acid (VITAMIN C) 1000 MG tablet Take 1,000 mg by mouth daily.  Marland Kitchen aspirin EC 81 MG tablet Take 81 mg by mouth daily.  Marland Kitchen atenolol (TENORMIN) 25 MG tablet TAKE 1 TABLET BY MOUTH ONCE A DAY  . Biotin 5 MG CAPS Take 5 mg by mouth daily.  . Magnesium 250 MG TABS Take 250 mg by mouth daily.  . Multiple Vitamins-Minerals (MULTIVITAMIN WITH MINERALS) tablet Take 1  tablet by mouth daily.     Allergies:   Patient has no known allergies.   Social History   Socioeconomic History  . Marital status: Divorced    Spouse name: Not on file  . Number of children: Not on file  . Years of education: Not on file  . Highest education level: Not on file  Occupational History    Employer: RETIRED  Tobacco Use  . Smoking status: Never Smoker  . Smokeless tobacco: Never Used  Vaping Use  . Vaping Use: Never used  Substance and Sexual Activity  . Alcohol use: No  . Drug use: No  . Sexual activity: Not on file  Other Topics Concern  . Not on file  Social History Narrative  . Not on file   Social Determinants of Health   Financial Resource Strain: Not on file  Food Insecurity: Not on file  Transportation  Needs: Not on file  Physical Activity: Not on file  Stress: Not on file  Social Connections: Not on file     Family History: The patient's family history includes Aneurysm in his father; Arthritis/Rheumatoid in his mother; Chronic fatigue in his brother; Leukemia in his sister; Prostate cancer in his father. There is no history of Colon cancer.  ROS:   Please see the history of present illness.     All other systems reviewed and are negative.  EKGs/Labs/Other Studies Reviewed:    The following studies were reviewed today:  ECHO 06/16/20: [facility]  INTERPRETATION(S)  1. Normal left ventricular size and function. LVEF >55%.  2. Mild left atrial enlargement.  3. Moderate right atrial enlargement.  4. Mildly thickened aortic valve without stenosis.  5. Mild mitral and tricuspid regurgitation.  6. No significant valvular stenosis.    LE Venous Dopplers 06/16/20:  CONCLUSIONS:  No evidence of acute or chronic DVT in the bilateral lower extremities by  criteria.    LE arterial duplex 05/24/20:  Right ankle brachial index (ABI) of 1.30 is consistent with no evidence of  arterial occlusive disease at rest. Continuous wave Doppler waveforms at  the ankle of the posterior tibial artery were triphasic and biphasic at  the dorsalis pedis artery. Digit pressure is consistent with healing.    Left ankle brachial index (ABI) of 1.29 is consistent with no evidence of  arterial occlusive disease at rest. Continuous wave Doppler waveforms at  the ankle of the posterior tibial artery were triphasic and triphasic at  the dorsalis pedisartery. Digit pressure is consistent with healing.      EKG:  EKG is  ordered today.  The ekg ordered today demonstrates sinus bradycardia 43 incomplete right bundle branch block.  Prior ECG showed heart rate of 49.  Similar.  Recent Labs: 11/12/2019: BUN 24; Creatinine, Ser 0.93; Potassium 4.1; Sodium 139  Recent Lipid Panel    Component Value Date/Time    CHOL 182 04/29/2018 1439   TRIG 41.0 04/29/2018 1439   HDL 77.90 04/29/2018 1439   CHOLHDL 2 04/29/2018 1439   VLDL 8.2 04/29/2018 1439   LDLCALC 96 04/29/2018 1439     Risk Assessment/Calculations:      Physical Exam:    VS:  BP 110/80 (BP Location: Left Arm, Patient Position: Sitting, Cuff Size: Normal)   Pulse (!) 43   Ht 5\' 9"  (1.753 m)   Wt 145 lb (65.8 kg)   SpO2 98%   BMI 21.41 kg/m     Wt Readings from Last 3 Encounters:  08/24/20 145 lb (  65.8 kg)  08/04/19 139 lb 1.9 oz (63.1 kg)  04/01/19 144 lb (65.3 kg)     GEN:  Well nourished, well developed in no acute distress HEENT: Normal NECK: No JVD; No carotid bruits LYMPHATICS: No lymphadenopathy CARDIAC: Sinus bradycardia, no murmurs, rubs, gallops RESPIRATORY:  Clear to auscultation without rales, wheezing or rhonchi  ABDOMEN: Soft, non-tender, non-distended MUSCULOSKELETAL:  No edema; No deformity  SKIN: Warm and dry NEUROLOGIC:  Alert and oriented x 3 PSYCHIATRIC:  Normal affect   ASSESSMENT:    1. PSVT (paroxysmal supraventricular tachycardia) ([place])   2. Aortic atherosclerosis (HCC)    PLAN:    In order of problems listed above:  Paroxysmal supraventricular tachycardia -Currently is on low-dose atenolol 25 mg a day.  We have discussed that given his bradycardia we may need to stop this at some point.  Since it suppresses his PSVT fairly well, he would like to continue.  Seems reasonable.  If we run into trouble with always ask EP to further evaluation.  Sinus bradycardia -43, 49, 50, asymptomatic.  No syncope.  Watch with atenolol 25 mg.  At this point, he is asymptomatic with this, does not require pacemaker.  However he does have quite a few palpitations, brief SVT type episodes.  I would like to refer him to electrophysiology for consultation.  Perhaps stopping the atenolol and starting something such as flecainide might be plausible.  I would like to have some guidance from EP however.  Aortic  atherosclerosis -Moderate plaque was noted on ultrasound.  Currently taking aspirin 81 mg.  I also recommended statin therapy.  Explained the benefits in this situation.  He does not wish to take.  I also recommended that he have a coronary calcium score given his increased cardiovascular risk.  This would help guide both aspirin and statin therapy.  We will go ahead and order.  He does live in [place].  If coronary artery calcification is present, I would once again strongly encourage statin therapy, Crestor 20 mg for instance.  Lower extremity edema -Likely dependent edema, Dopplers as above were normal.  Arterial Dopplers were also normal.  Overall expressed to him that I would like for him to utilize statin therapy that is being recommended both by myself as well as his primary care doctor.      Medication Adjustments/Labs and Tests Ordered: Current medicines are reviewed at length with the patient today.  Concerns regarding medicines are outlined above.  Orders Placed This Encounter  Procedures  . CT CARDIAC SCORING (SELF PAY ONLY)  . Ambulatory referral to Cardiac Electrophysiology  . EKG 12-Lead   No orders of the defined types were placed in this encounter.   Patient Instructions  Medication Instructions:   Your physician recommends that you continue on your current medications as directed. Please refer to the Current Medication list given to you today.  *If you need a refill on your cardiac medications before your next appointment, please call your pharmacy*   You have been referred to SEE AN ELECTROPHYSIOLOGIST MD IN OUR OFFICE FOR PSVT--PATIENT WOULD LIKE THIS APPOINTMENT DONE SAME DAY AS HE HAS HIS CALCIUM SCORE DONE IN OUR OFFICE, PER DR. Marlou Porch   Testing/Procedures:  CARDIAC CALCIUM SCORE (SELF PAY) DONE HERE IN OUR OFFICE--PT WOULD LIKE THIS APPOINTMENT TO BE SAME DAY AS HE COMES TO SEE EP FOR CONSULT PER DR. Marlou Porch   Follow-Up: At [facility], you and your  health needs are our priority.  As part of our  continuing mission to provide you with exceptional heart care, we have created designated Provider Care Teams.  These Care Teams include your primary Cardiologist (physician) and Advanced Practice Providers (APPs -  Physician Assistants and Nurse Practitioners) who all work together to provide you with the care you need, when you need it.  We recommend signing up for the patient portal called "MyChart".  Sign up information is provided on this After Visit Summary.  MyChart is used to connect with patients for Virtual Visits (Telemedicine).  Patients are able to view lab/test results, encounter notes, upcoming appointments, etc.  Non-urgent messages can be sent to your provider as well.   To learn more about what you can do with MyChart, go to [email or web address].    Your next appointment:   1 year(s)  The format for your next appointment:   In Person  Provider:   Candee Furbish, MD        Signed, Candee Furbish, MD  08/24/2020 10:06 AM    [place]

## 2020-11-19 IMAGING — DX DG PORTABLE PELVIS
1 series · 1 of 1 positions shown · non-contrast
Comparison: 06/03/2018 pelvic radiograph

CLINICAL DATA: Right total hip arthroplasty

EXAM:
PORTABLE PELVIS 1-2 VIEWS

[pelvis ap]
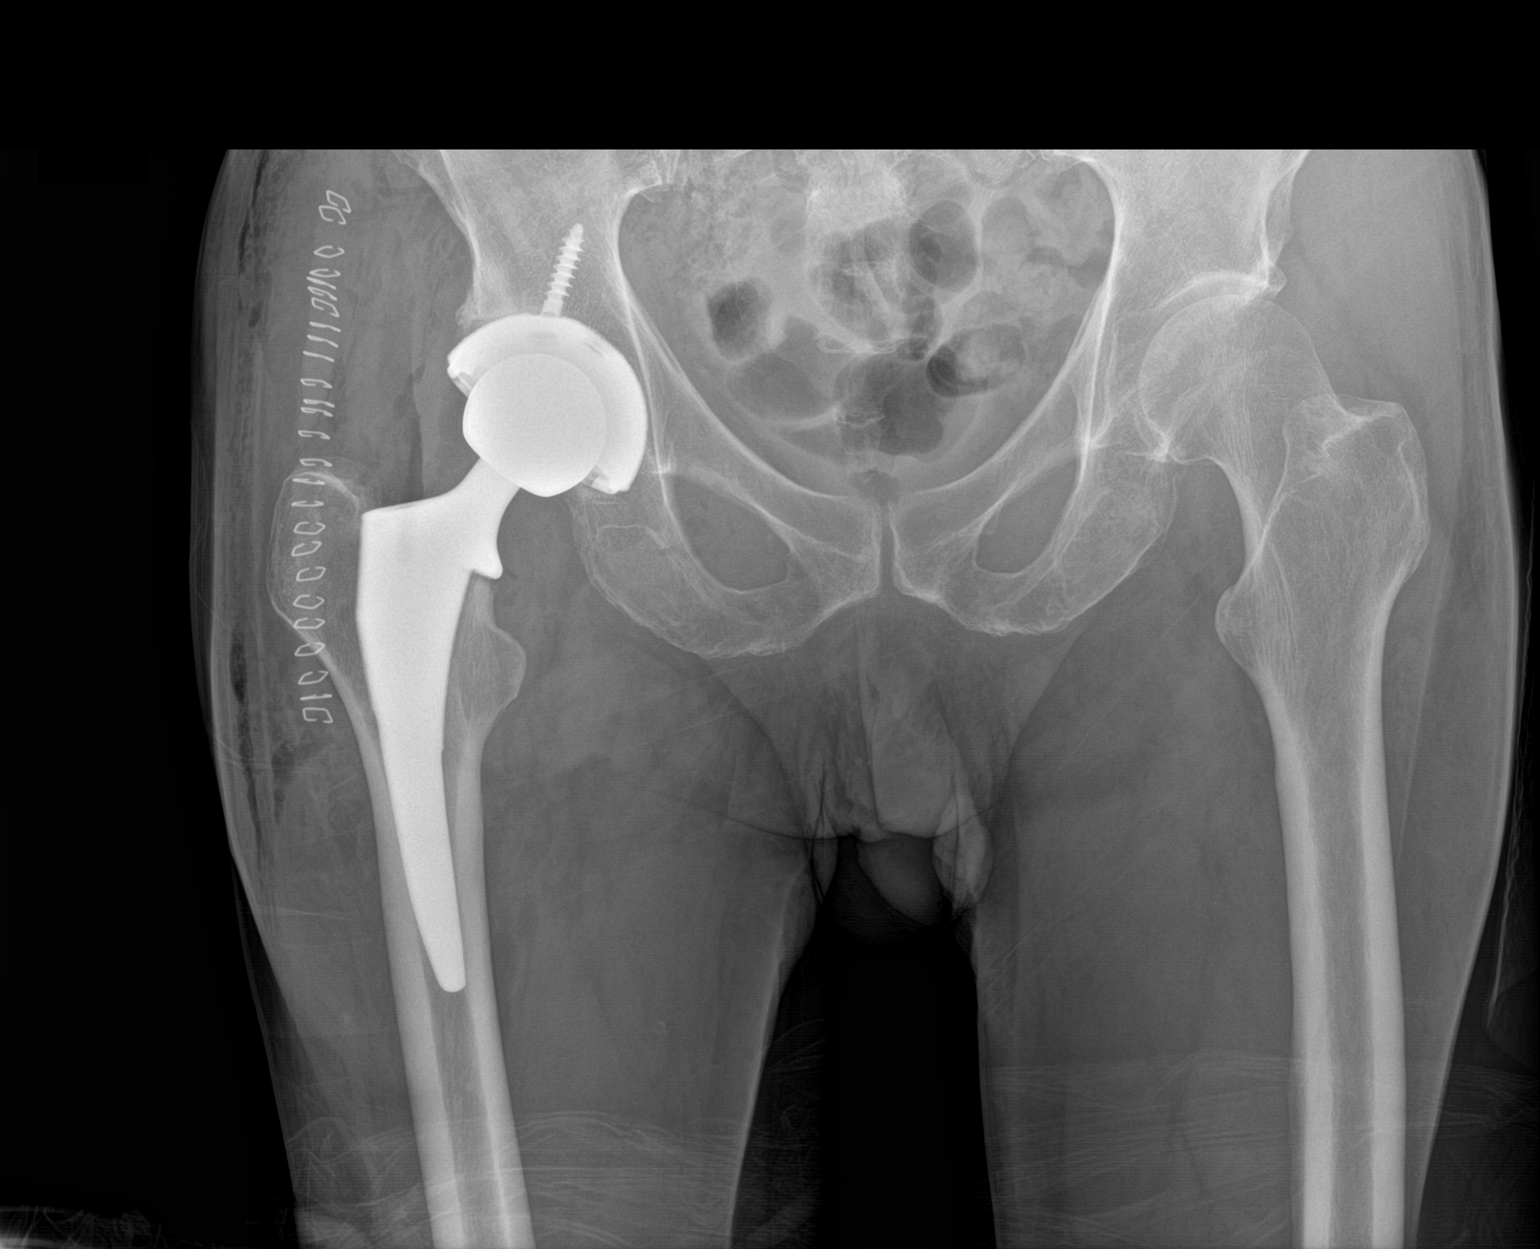

[1 of 1 positions shown; findings below may reference images not displayed]

FINDINGS: Status post right total hip arthroplasty with well-positioned right
acetabular and proximal right femoral prostheses. No hardware
fracture. No evidence of hip dislocation on this single frontal
view. Expected postoperative soft tissue gas surrounding the right
hip. Vertical skin staples lateral to the right hip. No osseous
fracture. No suspicious focal osseous lesions.
IMPRESSION: Satisfactory immediate postoperative single frontal view appearance
status post right total hip arthroplasty.

## 2020-11-19 IMAGING — RF DG C-ARM 1-60 MIN
1 series · 3 of 3 positions shown · non-contrast
Comparison: radiographs dated 06/03/2018

CLINICAL DATA: Osteoarthritis of the right hip. Status post right
total hip arthroplasty.

EXAM:
OPERATIVE RIGHT HIP (WITH PELVIS IF PERFORMED) 1 VIEW
TECHNIQUE: Fluoroscopic spot image(s) were submitted for interpretation
post-operatively.

[Series 1: unknown protocol · 0.20mm/px · 3 of 3 slices shown]
[im 1/3]
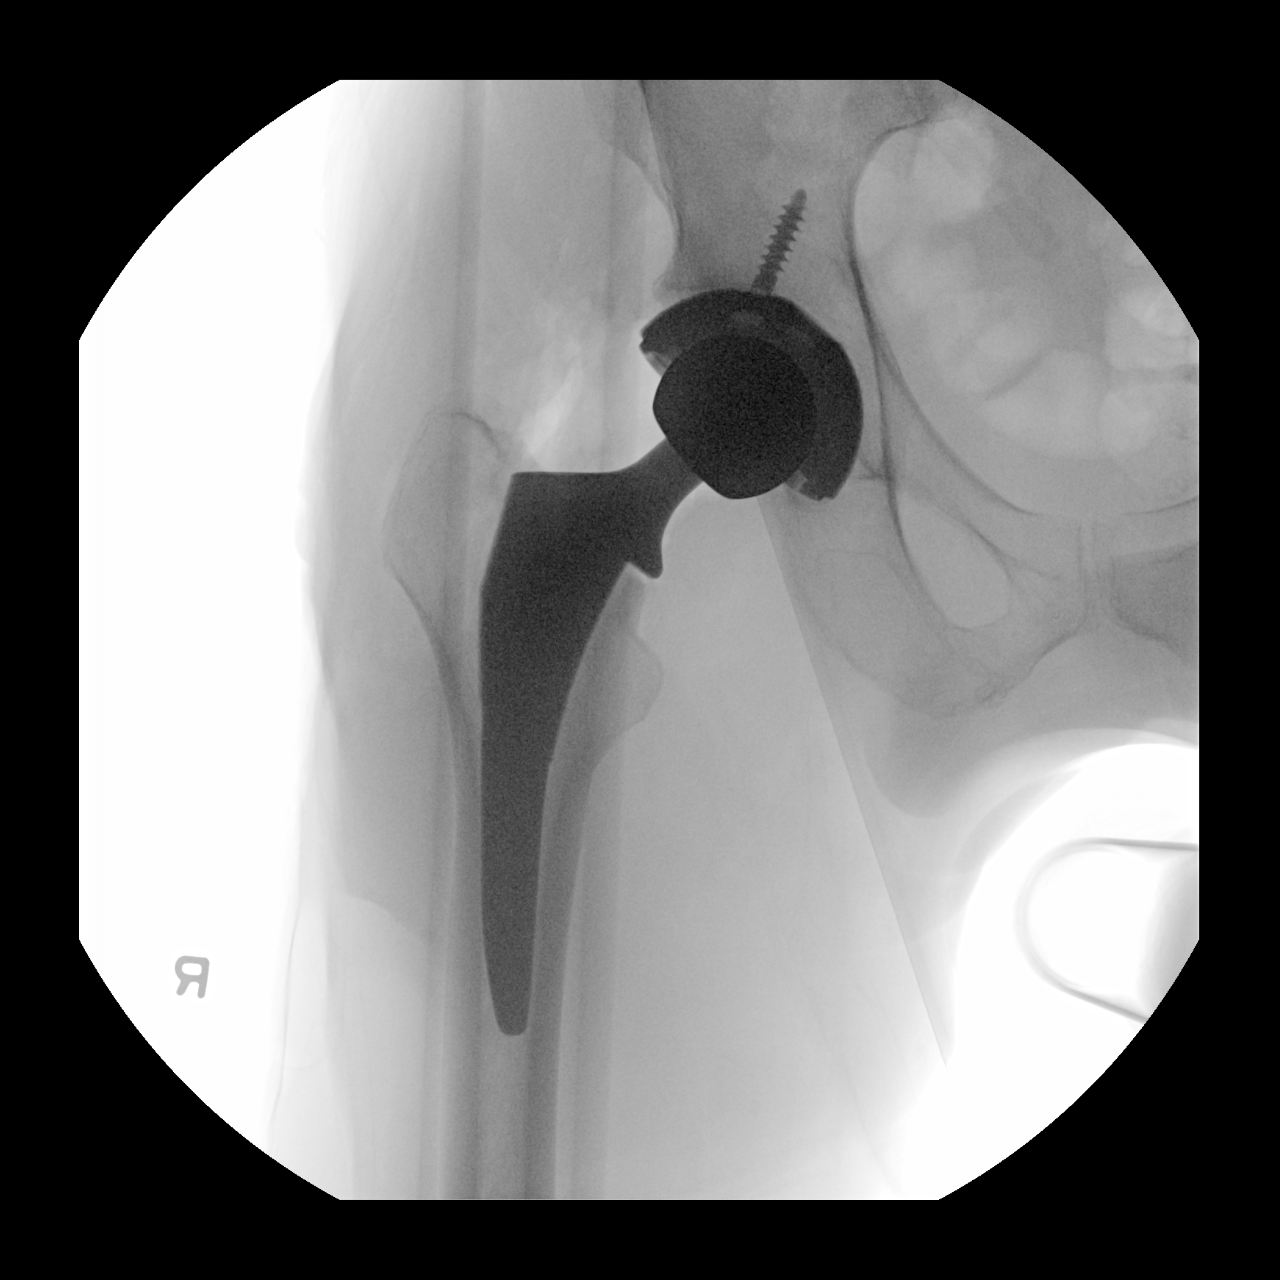
[im 2/3]
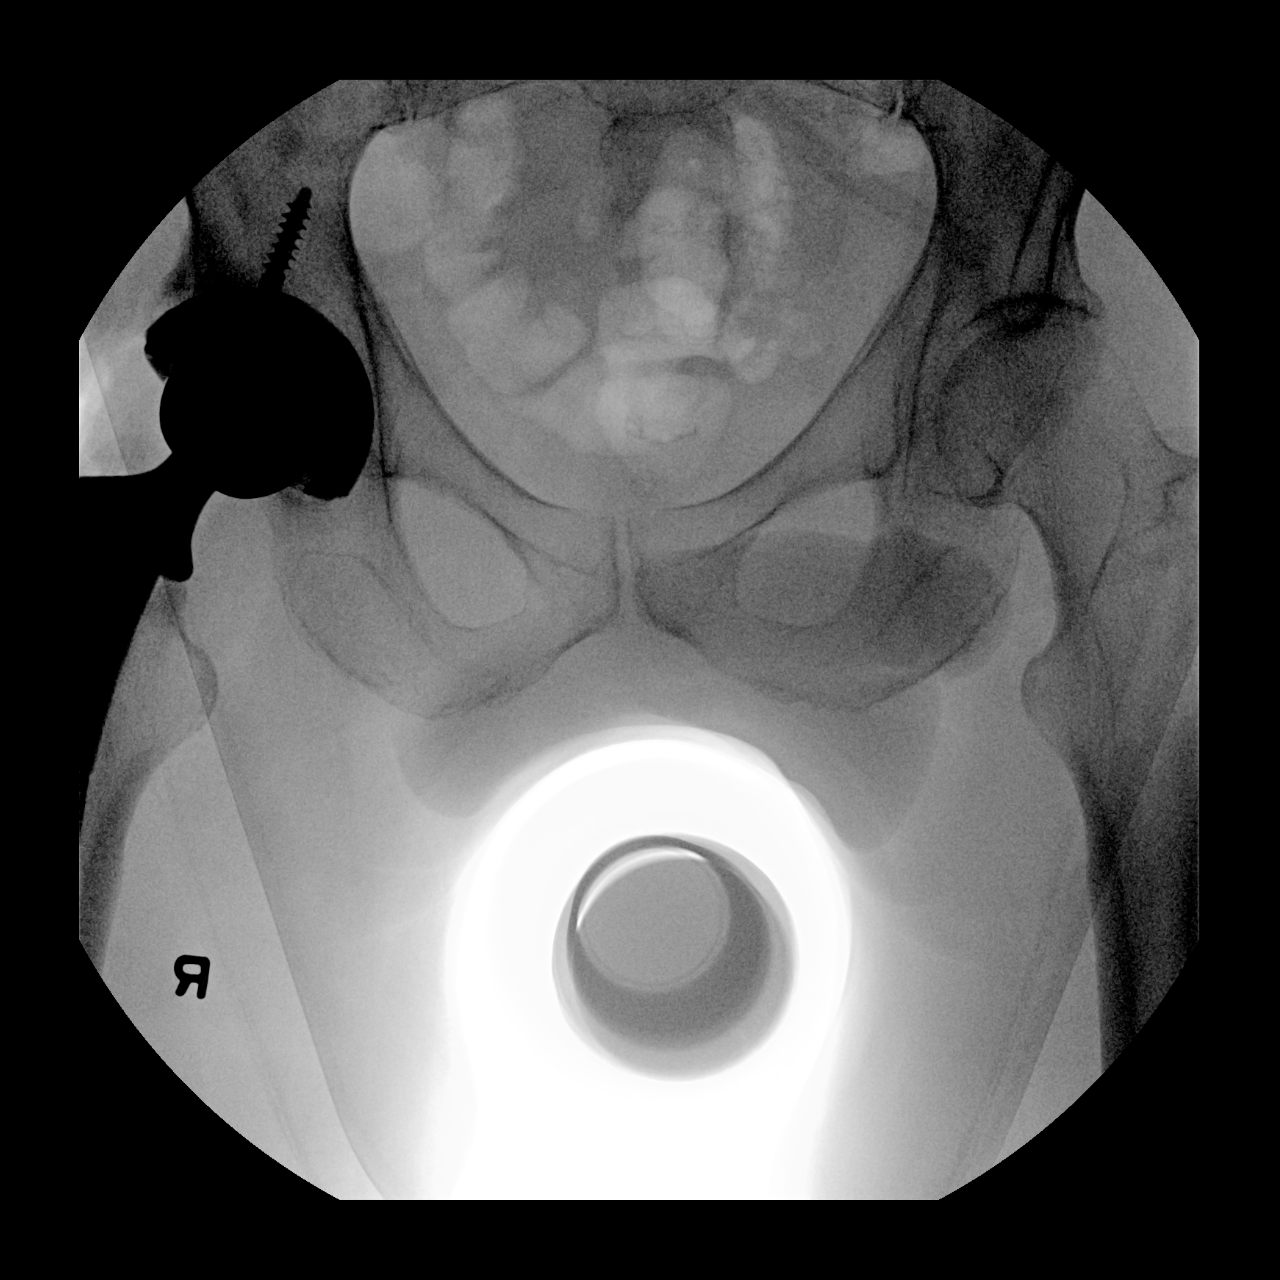
[im 3/3]
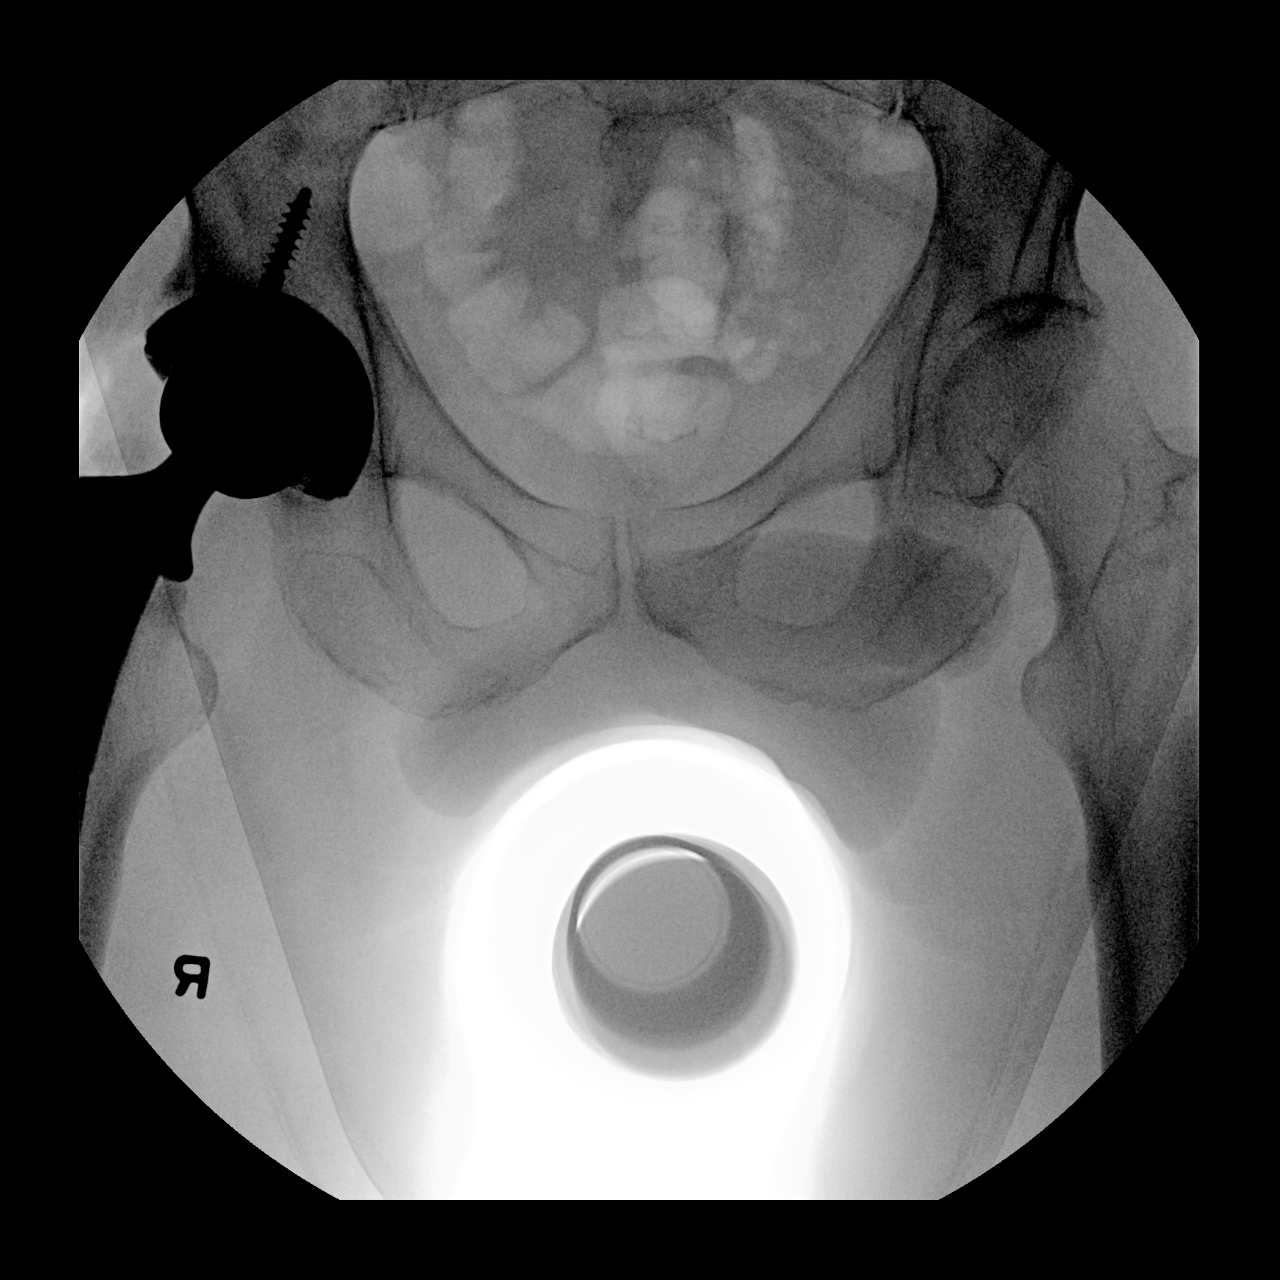

[3 of 3 positions shown; findings below may reference images not displayed]

FINDINGS: AP views of the right hip and pelvis demonstrate that the acetabular
and femoral components of the right total hip prosthesis appear in
excellent position in the AP projection. No fractures.
IMPRESSION: Satisfactory appearance of the right total hip prosthesis.

FLUOROSCOPY TIME:  17 seconds

C-arm fluoroscopic images were obtained intraoperatively and
submitted for post operative interpretation.

## 2021-08-10 ENCOUNTER — Encounter: Payer: Self-pay | Admitting: Gastroenterology
